# Patient Record
Sex: Male | Born: 1955 | Race: White | Hispanic: No | Marital: Married | Smoking: Former smoker
Health system: Southern US, Community
[De-identification: ages and names within clinical notes are randomized; demographics above are authoritative.]

## PROBLEM LIST (undated history)

## (undated) DIAGNOSIS — M1990 Unspecified osteoarthritis, unspecified site: Secondary | ICD-10-CM

## (undated) DIAGNOSIS — Z8709 Personal history of other diseases of the respiratory system: Secondary | ICD-10-CM

## (undated) DIAGNOSIS — G8929 Other chronic pain: Secondary | ICD-10-CM

## (undated) DIAGNOSIS — I10 Essential (primary) hypertension: Secondary | ICD-10-CM

## (undated) DIAGNOSIS — R200 Anesthesia of skin: Secondary | ICD-10-CM

## (undated) DIAGNOSIS — E785 Hyperlipidemia, unspecified: Secondary | ICD-10-CM

## (undated) DIAGNOSIS — M549 Dorsalgia, unspecified: Secondary | ICD-10-CM

## (undated) DIAGNOSIS — M2550 Pain in unspecified joint: Secondary | ICD-10-CM

## (undated) DIAGNOSIS — K219 Gastro-esophageal reflux disease without esophagitis: Secondary | ICD-10-CM

## (undated) DIAGNOSIS — M5030 Other cervical disc degeneration, unspecified cervical region: Secondary | ICD-10-CM

## (undated) DIAGNOSIS — T4145XA Adverse effect of unspecified anesthetic, initial encounter: Secondary | ICD-10-CM

## (undated) HISTORY — DX: Other cervical disc degeneration, unspecified cervical region: M50.30

## (undated) HISTORY — PX: COLONOSCOPY: SHX174

## (undated) HISTORY — PX: SHOULDER ARTHROSCOPY: SHX128

## (undated) HISTORY — PX: ANKLE SURGERY: SHX546

## (undated) HISTORY — PX: CARDIAC CATHETERIZATION: SHX172

## (undated) HISTORY — PX: CERVICAL FUSION: SHX112

## (undated) HISTORY — PX: TONSILLECTOMY: SUR1361

## (undated) HISTORY — PX: BACK SURGERY: SHX140

---

## 1999-10-23 ENCOUNTER — Ambulatory Visit (HOSPITAL_COMMUNITY): Admission: RE | Admit: 1999-10-23 | Discharge: 1999-10-23 | Payer: Self-pay | Admitting: Neurosurgery

## 1999-10-23 ENCOUNTER — Encounter: Payer: Self-pay | Admitting: Neurosurgery

## 1999-11-06 ENCOUNTER — Encounter: Payer: Self-pay | Admitting: Neurosurgery

## 1999-11-06 ENCOUNTER — Ambulatory Visit (HOSPITAL_COMMUNITY): Admission: RE | Admit: 1999-11-06 | Discharge: 1999-11-06 | Payer: Self-pay | Admitting: Neurosurgery

## 1999-11-28 ENCOUNTER — Encounter: Payer: Self-pay | Admitting: Neurosurgery

## 1999-11-28 ENCOUNTER — Ambulatory Visit (HOSPITAL_COMMUNITY): Admission: RE | Admit: 1999-11-28 | Discharge: 1999-11-28 | Payer: Self-pay | Admitting: Neurosurgery

## 2000-07-26 ENCOUNTER — Encounter: Payer: Self-pay | Admitting: Neurosurgery

## 2000-07-26 ENCOUNTER — Ambulatory Visit (HOSPITAL_COMMUNITY): Admission: RE | Admit: 2000-07-26 | Discharge: 2000-07-26 | Payer: Self-pay | Admitting: Neurosurgery

## 2000-08-09 ENCOUNTER — Encounter: Payer: Self-pay | Admitting: Neurosurgery

## 2000-08-09 ENCOUNTER — Ambulatory Visit (HOSPITAL_COMMUNITY): Admission: RE | Admit: 2000-08-09 | Discharge: 2000-08-09 | Payer: Self-pay | Admitting: Neurosurgery

## 2001-03-07 ENCOUNTER — Encounter: Payer: Self-pay | Admitting: Family Medicine

## 2001-03-07 ENCOUNTER — Ambulatory Visit (HOSPITAL_COMMUNITY): Admission: RE | Admit: 2001-03-07 | Discharge: 2001-03-07 | Payer: Self-pay | Admitting: Family Medicine

## 2001-04-05 ENCOUNTER — Encounter: Payer: Self-pay | Admitting: Neurosurgery

## 2001-04-06 ENCOUNTER — Observation Stay (HOSPITAL_COMMUNITY): Admission: RE | Admit: 2001-04-06 | Discharge: 2001-04-07 | Payer: Self-pay | Admitting: Neurosurgery

## 2001-04-06 ENCOUNTER — Encounter: Payer: Self-pay | Admitting: Neurosurgery

## 2001-11-21 ENCOUNTER — Observation Stay (HOSPITAL_COMMUNITY): Admission: EM | Admit: 2001-11-21 | Discharge: 2001-11-23 | Payer: Self-pay | Admitting: Emergency Medicine

## 2001-11-21 ENCOUNTER — Encounter: Payer: Self-pay | Admitting: Emergency Medicine

## 2001-12-12 ENCOUNTER — Ambulatory Visit (HOSPITAL_COMMUNITY): Admission: RE | Admit: 2001-12-12 | Discharge: 2001-12-12 | Payer: Self-pay | Admitting: *Deleted

## 2002-01-20 ENCOUNTER — Ambulatory Visit (HOSPITAL_COMMUNITY): Admission: RE | Admit: 2002-01-20 | Discharge: 2002-01-20 | Payer: Self-pay | Admitting: Cardiology

## 2002-01-20 ENCOUNTER — Encounter: Payer: Self-pay | Admitting: Cardiology

## 2002-02-02 ENCOUNTER — Ambulatory Visit (HOSPITAL_COMMUNITY): Admission: RE | Admit: 2002-02-02 | Discharge: 2002-02-02 | Payer: Self-pay | Admitting: Family Medicine

## 2002-02-02 ENCOUNTER — Encounter: Payer: Self-pay | Admitting: Family Medicine

## 2002-12-01 ENCOUNTER — Encounter: Payer: Self-pay | Admitting: Internal Medicine

## 2002-12-01 ENCOUNTER — Ambulatory Visit (HOSPITAL_COMMUNITY): Admission: RE | Admit: 2002-12-01 | Discharge: 2002-12-01 | Payer: Self-pay | Admitting: Internal Medicine

## 2004-08-18 ENCOUNTER — Ambulatory Visit (HOSPITAL_COMMUNITY): Admission: RE | Admit: 2004-08-18 | Discharge: 2004-08-18 | Payer: Self-pay | Admitting: General Surgery

## 2004-11-19 ENCOUNTER — Ambulatory Visit (HOSPITAL_COMMUNITY): Admission: RE | Admit: 2004-11-19 | Discharge: 2004-11-19 | Payer: Self-pay | Admitting: Family Medicine

## 2006-03-05 ENCOUNTER — Ambulatory Visit (HOSPITAL_COMMUNITY): Admission: RE | Admit: 2006-03-05 | Discharge: 2006-03-05 | Payer: Self-pay | Admitting: Family Medicine

## 2006-11-21 ENCOUNTER — Emergency Department (HOSPITAL_COMMUNITY): Admission: EM | Admit: 2006-11-21 | Discharge: 2006-11-21 | Payer: Self-pay | Admitting: Emergency Medicine

## 2006-11-24 ENCOUNTER — Ambulatory Visit (HOSPITAL_COMMUNITY): Admission: RE | Admit: 2006-11-24 | Discharge: 2006-11-24 | Payer: Self-pay | Admitting: Family Medicine

## 2007-05-24 ENCOUNTER — Ambulatory Visit (HOSPITAL_COMMUNITY): Admission: RE | Admit: 2007-05-24 | Discharge: 2007-05-24 | Payer: Self-pay | Admitting: Family Medicine

## 2007-10-22 ENCOUNTER — Emergency Department (HOSPITAL_COMMUNITY): Admission: RE | Admit: 2007-10-22 | Discharge: 2007-10-22 | Payer: Self-pay | Admitting: Internal Medicine

## 2007-10-28 ENCOUNTER — Ambulatory Visit (HOSPITAL_COMMUNITY): Admission: RE | Admit: 2007-10-28 | Discharge: 2007-10-28 | Payer: Self-pay | Admitting: Internal Medicine

## 2007-12-30 ENCOUNTER — Ambulatory Visit (HOSPITAL_COMMUNITY): Admission: RE | Admit: 2007-12-30 | Discharge: 2007-12-30 | Payer: Self-pay | Admitting: Internal Medicine

## 2009-04-29 ENCOUNTER — Ambulatory Visit (HOSPITAL_COMMUNITY): Admission: RE | Admit: 2009-04-29 | Discharge: 2009-04-29 | Payer: Self-pay | Admitting: Family Medicine

## 2009-04-30 ENCOUNTER — Ambulatory Visit (HOSPITAL_COMMUNITY): Admission: RE | Admit: 2009-04-30 | Discharge: 2009-04-30 | Payer: Self-pay | Admitting: Family Medicine

## 2009-10-07 ENCOUNTER — Ambulatory Visit (HOSPITAL_COMMUNITY): Admission: RE | Admit: 2009-10-07 | Discharge: 2009-10-07 | Payer: Self-pay | Admitting: Cardiology

## 2009-10-10 ENCOUNTER — Ambulatory Visit (HOSPITAL_COMMUNITY): Admission: RE | Admit: 2009-10-10 | Discharge: 2009-10-10 | Payer: Self-pay | Admitting: Cardiology

## 2010-03-17 ENCOUNTER — Ambulatory Visit (HOSPITAL_COMMUNITY): Admission: RE | Admit: 2010-03-17 | Discharge: 2010-03-17 | Payer: Self-pay | Admitting: Neurosurgery

## 2010-04-16 ENCOUNTER — Ambulatory Visit (HOSPITAL_COMMUNITY): Admission: RE | Admit: 2010-04-16 | Discharge: 2010-04-16 | Payer: Self-pay | Admitting: Neurosurgery

## 2010-09-03 LAB — CBC
Hemoglobin: 12.7 g/dL — ABNORMAL LOW (ref 13.0–17.0)
RBC: 4.44 MIL/uL (ref 4.22–5.81)
WBC: 7.7 10*3/uL (ref 4.0–10.5)

## 2010-09-03 LAB — SURGICAL PCR SCREEN
MRSA, PCR: NEGATIVE
Staphylococcus aureus: POSITIVE — AB

## 2010-09-03 LAB — BASIC METABOLIC PANEL
Calcium: 9.7 mg/dL (ref 8.4–10.5)
GFR calc Af Amer: >60 mL/min (ref >60)
GFR calc non Af Amer: >60 mL/min (ref >60)
Sodium: 140 mEq/L (ref 135–145)

## 2010-11-07 NOTE — H&P (Signed)
[facility]  Patient:    William Gill, William Gill Visit Number: [id number] MRN: [id number]          Service Type: OBV Location: 2A A227 01 Attending Physician:  Patrica Duel Dictated by:   Patrica Duel, M.D. Admit Date:  11/21/2001 Discharge Date: 11/23/2001                           History and Physical  CHIEF COMPLAINT:  Palpitations.  HISTORY OF PRESENT ILLNESS:  This is a 55 year old male with an essentially benign past history.  He works at [employer].  He has been treated in the past with atenolol 50 mg for "fast heart beat and high blood pressure" for approximately three years.  The patient has been under a great deal of stress in the past several weeks due to his wifes recent diagnosis of pulmonary hypertension.  At work, the patient experienced palpitations and went to the nurse.  She reported his blood pressure at 180/120 with a heart rate of 140.  This gradually resolved. During this episode, he had no chest pain, shortness of breath, diaphoresis or syncope.  He was brought to the emergency department where his initial evaluation was essentially benign.  Occasional PVCs were reported by the EMS, though no strips were obtained.  He is admitted for further evaluation and therapy of hypertension and arrhythmia of questionable morphology.  There is no history of headache or neurologic deficits, abdominal pain, nausea, vomiting, diarrhea or genitourinary symptoms.  CURRENT MEDICATIONS:  Atenolol 50 mg q.d.  ALLERGIES:  None known.  PAST MEDICAL HISTORY:  As noted above.  FAMILY HISTORY:  Mother died at age 63 of lung carcinoma.  Father is healthy at age 37, status post left carotid endarterectomy.  No reported coronary disease.  REVIEW OF SYSTEMS:  Negative except as mentioned.  SOCIAL HISTORY:  He quit smoking cigarettes approximately one year ago.  He has occasional beer only.    PHYSICAL EXAMINATION:  GENERAL:  A very pleasant  male in no acute distress.  VITAL SIGNS AT PRESENTATION:  Pulse 98, respirations 20, blood pressure 167/93, O2 saturation 98%.  HEENT:  Normocephalic, atraumatic.  The pupils are equal.  Ears, nose and throat benign.  NECK:  Supple.  No bruits, thyromegaly or lymphadenopathy.  LUNGS:  Clear.  HEART:  Heart sounds are normal without murmurs, rubs, or gallops.  Heart rate is approximately 67 and no ectopy noted on monitor.  ABDOMEN:  Nontender and nondistended.  No bruits, masses or organomegaly.  EXTREMITIES:  No clubbing, cyanosis, or edema.  NEUROLOGIC:  No focal deficits.  LABORATORY AND ACCESSORY DATA:  EKG:  Normal sinus rhythm.  Poor R wave progression.  Nonspecific ST-T changes.  Cardiac enzymes negative.  MET-7 normal.  CBC normal.  ASSESSMENT: 1. Palpitations and arrhythmia which is yet to be defined.  Occasional    premature ventricular contractions documented. 2. History of tachyarrhythmias and hypertension, currently on beta blocker.  PLAN:  Rule out MI, cardiology consult, increase beta blocker, routine lab review including thyroid functions and add anxiolytics p.r.n. and will follow and treat expectantly. Dictated by:   Patrica Duel, M.D. Attending Physician:  Patrica Duel DD:  11/22/01 TD:  11/23/01 Job: [id number]

## 2010-11-07 NOTE — Cardiovascular Report (Signed)
NAME:  William Gill, William Gill NO.:  [id number]   MEDICAL RECORD NO.:  [id number]                   PATIENT TYPE:  OIB   LOCATION:  2899                                 FACILITY:  [facility]   PHYSICIAN:  Cristy Hilts. Jacinto Halim, M.D.                  DATE OF BIRTH:  1955-07-28   DATE OF PROCEDURE:  01/20/2002  DATE OF DISCHARGE:  01/20/2002                              CARDIAC CATHETERIZATION   PROCEDURE PERFORMED:  1. Left ventriculography.  2. Selective right and left arteriographies.  3. Right femoral angiography and closure of right femoral artery access with     Perclose.   INDICATION:  The patient is a 55 year old gentleman with history of  hypertension, hypercholesterolemia, family history of premature coronary  artery disease, who presents with palpitations and who had undergone a  Cardiolite stress test that revealed anterior wall ischemia.  He was brought  to the cardiac catheterization lab to evaluate for coronary anatomy.   HEMODYNAMIC DATA:  The left ventricular pressures were 131 with an end-  diastolic pressure of 19 mmHg.  The aortic pressure was 132/75 with a mean  of 96 mmHg.  There was no pressure gradient across the aortic valve.   ANGIOGRAPHIC DATA:  Left ventricle:  Left ventricular systolic function was  normal at 65%.  There is no wall motion abnormality.  There is no  significant mitral regurgitation.   Right coronary artery:  Right coronary artery is a nondominant artery.  It  is normal.   Left main coronary artery:  Left main coronary artery is a large-caliber  vessel.  It is normal.   Circumflex coronary artery:  Circumflex coronary artery is a large-caliber  vessel.  It gives origin to a large obtuse marginal 1 and also gives origin  to several small PDA branches.  The circumflex coronary artery is a dominant  artery.   Ramus intermedius:  The ramus branch is normal and a moderate-sized vessel.   Left anterior descending artery:  The  left anterior descending artery is a  large-caliber vessel.  It gives origin to  a small diagonal 1 and diagonal  2.  It wraps around the apex.  It is __________ .   Right femoral artery angiography revealed good arterial access.   IMPRESSION:  1. Normal left ventricular systolic function with ejection fraction 65%.     Normal coronary arteries.  2. Mild elevated left ventricular end-diastolic pressure secondary to     hypertension and hypertensive heart disease.   RECOMMENDATION:  Continued medical therapy is advised.   TECHNIQUE OF PROCEDURE:  Under the usual sterile precautions, using a 6-  French right femoral artery __________ catheter was advised to the ascending  aorta over a 0.035-mm __________ .  The catheter was gently advanced to the  left ventricle and left ventricular pressure was monitored.  Hand contrast  injection of the left ventricle  was performed, both in the LAO and RAO  positions, and the catheter was, on first attempt, pulled back into the  ascending aorta and pressure gradient of the aortic valve was monitored.  Right coronary artery was selectively engaged and angiography was performed.  Because of significant spasm noted initially before the angiogram, 100 mcg  of intracoronary nitroglycerin were administered.  Then the catheter was  manipulated and engaged the left main coronary artery and angiography was  performed.  Then the catheter was pulled out of the body.  Right femoral  angiography was performed through the arterial access and the access was  closed with Perclose and excellent hemostasis obtained.  Patient was  transferred to the recovery in a stable condition.  Patient tolerated  procedure well.                                               Cristy Hilts. Jacinto Halim, M.D.    Pilar Plate  D:  01/20/2002  T:  01/26/2002  Job:  [id number]   cc:   Kirk Ruths, M.D.   Sherral Hammers, M.D.

## 2010-11-07 NOTE — Op Note (Signed)
[place]  Patient:    William Gill, William Gill Visit Number: [id number] MRN: [id number]          Service Type: SUR Location: 3000 3004 01 Attending Physician:  Coletta Memos Dictated by:   Mena Goes. Franky Macho, M.D. Proc. Date: 04/06/01 Admit Date:  04/06/2001 Discharge Date: 04/07/2001                             Operative Report  PREOPERATIVE DIAGNOSIS:  Left L5 radiculopathy.  POSTOPERATIVE DIAGNOSIS:  Left L5 radiculopathy.  OPERATION PERFORMED:  Far lateral diskectomy L5-S1, L5 foraminotomy with microdissection.  SURGEON:  Kyle L. Franky Macho, M.D.  ASSISTANT:  Hewitt Shorts, M.D.  COMPLICATIONS:  None.  ANESTHESIA:  General endotracheal.  INDICATIONS FOR PROCEDURE:  The patient is a gentleman who presented with pain in the left lower extremity which he has had now for the last two years. Conservative treatments have been attempted in the past and he had improved but he came back with worsening pain.  Conservative measures at this time did not help.  I recommended and he agreed to undergo an L5 decompression.  DESCRIPTION OF PROCEDURE:  The patient was taken to the operating room and intubated and placed under general anesthesia without difficulty.  He was placed on a Wilson frame and all pressure points were properly padded.  The skin was prepped and he was draped in a sterile fashion.  I placed a spinal needle and preoperative x-ray showed the needle to be pointing to the spinous process of L5.  Using that as a guide, I then opened the skin, took this down to the L5 spinous process and exposed the L5 lamina along with S1.  I took another x-ray and it showed I was working in the correct level.  I then identified a facet at L5-S1 and the facet at L4-5.  Both were quite hypertrophic.  I then exposed the pars interarticularis and drilled that out medially.  After removing a significant amount of tissue, I was then able to identify the path  of the nerve root.  I brought the microscope into the operative field and with Dr. Gae Dry assistance, we were able to then remove enough soft tissue to expose the L5 nerve root and the neural foramen. I did generous foraminotomies going both medially and then laterally.  I did a superior facetectomy of S1 on the left side.  I also performed a semihemilaminectomy of L5 to expose the disk space and the thecal sac.  The compression, however, was clearly proximal to that level and was at the L5 root.  After retracting the L5 root rostrally, I was able to appreciate what were spurs and a small disk bulge at L5-S1.  I opened that with a #11 blade and with microscopic dissection, I was able to remove enough disk that I felt there was good decompression of the nerve root.  I then inspected the nerve root going out its pathway and felt that there was good decompression both medially and laterally.  I then irrigated the wound.  I then placed epidural steroids.  I then closed the wound in layered using Vicryl sutures.  Sterile dressing applied with Dermabond.  The patient tolerated the procedure well and was extubated moving all extremities. Dictated by:   Mena Goes. Franky Macho, M.D. Attending Physician:  Coletta Memos DD:  04/06/01 TD:  04/07/01 Job: [id number]

## 2010-11-07 NOTE — H&P (Signed)
NAME:  William Gill, William Gill NO.:  [id number]   MEDICAL RECORD NO.:  [id number]           PATIENT TYPE:   LOCATION:                                 FACILITY:   PHYSICIAN:  Dalia Heading, M.D.  DATE OF BIRTH:  December 27, 1955   DATE OF ADMISSION:  DATE OF DISCHARGE:  LH                                HISTORY & PHYSICAL   CHIEF COMPLAINT:  Hematochezia.   HISTORY OF PRESENT ILLNESS:  The patient is 55 year old white male who is  referred for endoscopic evaluation.  He needs colonoscopy for hematochezia.  He had episodes of blood per rectum several weeks ago.  No recent abdominal  pain, weight loss, nausea, vomiting, diarrhea, constipation or melena have  been noted.  He has never had a colonoscopy.  There is no family history of  colon carcinoma.   PAST MEDICAL HISTORY:  Includes hypertension and back difficulties.   PAST SURGICAL HISTORY:  Back surgery.   CURRENT MEDICATIONS:  Atenolol, Norvasc, Vytorin, Tricor, Naprosyn, Lorcet,  Prilosec.   ALLERGIES:  No known drug allergies.   REVIEW OF SYSTEMS:  Patient denies smoking, drinks only occasionally.  No  other cardiopulmonary difficulties are noted.   PHYSICAL EXAMINATION:  GENERAL:  Patient is a well-developed, well-nourished  white male in no acute distress.  VITAL SIGNS:  He is afebrile; vital signs are stable.  LUNGS:  Clear to auscultation with equal breath sounds bilaterally.  HEART:  Reveals a regular rate and rhythm without S3, S4 or murmurs.  ABDOMEN:  Soft, nontender, nondistended.  No hepatosplenomegaly or masses  are noted.  RECTAL:  Examination is deferred to the procedure.   IMPRESSION:  Hematochezia.   PLAN:  The patient was scheduled for a colonoscopy on August 18, 2004.  The risks and benefits of the procedure including bleeding and perforation  were fully explained to the patient, who gave informed consent.      MAJ/MEDQ  D:  08/05/2004  T:  08/05/2004  Job:  [id number]   cc:   Dalia Heading, M.D.  [address] [place]  [place]  [place] [address]  Fax: [phone]   Kirk Ruths, M.D.  [address]  [place]  [place] [address]  Fax: [phone]

## 2010-11-07 NOTE — H&P (Signed)
[facility]  Patient:    LYNDON, CHAPEL Visit Number: [id number] MRN: [id number]          Service Type: SUR Location: 3000 3004 01 Attending Physician:  Coletta Memos Dictated by:   Mena Goes. Franky Macho, M.D. Admit Date:  04/06/2001                           History and Physical  HISTORY OF PRESENT ILLNESS:  Fountain Derusha initially presented to me in December 2000.  At that time he had had pain in the left hip and thigh for approximately two to three months.  The pain was not preceded by trauma.  It did not go below the knee.  It radiated around the lateral thigh, sometimes into the medial region of the thigh.  There was no real back pain.  Soreness around the hip.  If he stood for too long, the pain became quite severe.  He works as a Location manager.  He did not and still does not have any bowel or bladder dysfunction.  Mr. Dorko underwent epidural steroids and has also taken Vicodin.  He continued to do well until March 31, 2001.  At that time he again was having pain in the left lower extremity from the hip to the level of the knee.  No problems on the right side.  The pain, he felt, was much more severe.  Recent MR scan showed a far lateral disk at L5-S1.  Again, radiologist seemed to think that 4-5 was more impressive.  I thought that that was absolutely wrong.  There is a fat signal surrounding the nerve root at L4-5 through its entire course in the neural foramen, and this was not the case at L5-S1, where lateral disk was certainly present and foraminal compression was obvious.  Mr. Going had 5/5 strength in the lower extremities.  Muscle tone, bulk, coordination are normal.  He could toe-walk and heel-walk without difficulty.  There was no clonus.  Toes are downgoing to plantar stimulation.  He had no Hoffmann sign.  Deep tendon reflexes 2+ at the knees and ankles bilaterally.  There were no cervical masses or bruits.  Lung fields clear.  Heart  regular rhythm and rate.  No murmurs or rubs.  Pulse was 68.  PAST MEDICAL HISTORY: 1. Hypertension. 2. Gastroesophageal reflux.  ALLERGIES:  No known drug allergies.  PAST SURGICAL HISTORY:  Left ankle operation 24 years ago.  SOCIAL HISTORY:  He smokes 1-1/2 packs of cigarettes since age 18.  Does not use illicit drugs. Does drink alcohol daily.  Height 6 feet 2 inches, weight approximately 188 pounds.  MEDICATIONS:  Atenolol, Aciphex, and occasional Tylox for pain.  IMPRESSION AND PLAN:  Mr. Osorto and I discussed his available options.  I believe at this point that I only have surgery to offer.  He had undergone epidural injections, which provided brief, temporary relief.  All steroids which were tried, again, by his family physician did not offer relief.  He has a far lateral disk at 5-1 and a tight neural foramen.  He has no real compression of the fourth nerve root in the neural foramen and certainly not in the disk space.  If there was any problem it would be at L5.  The procedure, including bleeding, infection, pain relief, need for further surgery, recurrent disks were discussed.  He understands and wishes to proceed. Dictated by:   Mena Goes. Franky Macho, M.D.  Attending Physician:  Coletta Memos DD:  04/06/01 TD:  04/07/01 Job: [id number]

## 2011-01-13 ENCOUNTER — Ambulatory Visit (HOSPITAL_COMMUNITY)
Admission: RE | Admit: 2011-01-13 | Discharge: 2011-01-13 | Disposition: A | Discharge status: Home / Self Care | Payer: BC Managed Care – PPO | Source: Ambulatory Visit | Attending: Family Medicine | Admitting: Family Medicine

## 2011-01-13 ENCOUNTER — Other Ambulatory Visit (HOSPITAL_COMMUNITY): Payer: Self-pay | Admitting: Family Medicine

## 2011-01-13 DIAGNOSIS — IMO0002 Reserved for concepts with insufficient information to code with codable children: Secondary | ICD-10-CM

## 2011-01-13 DIAGNOSIS — M542 Cervicalgia: Secondary | ICD-10-CM | POA: Insufficient documentation

## 2011-02-25 ENCOUNTER — Ambulatory Visit (HOSPITAL_COMMUNITY)
Admission: RE | Admit: 2011-02-25 | Discharge: 2011-02-25 | Disposition: A | Discharge status: Home / Self Care | Payer: BC Managed Care – PPO | Source: Ambulatory Visit | Attending: Neurosurgery | Admitting: Neurosurgery

## 2011-02-25 DIAGNOSIS — M542 Cervicalgia: Secondary | ICD-10-CM | POA: Insufficient documentation

## 2011-02-25 DIAGNOSIS — M539 Dorsopathy, unspecified: Secondary | ICD-10-CM | POA: Insufficient documentation

## 2011-02-25 DIAGNOSIS — IMO0001 Reserved for inherently not codable concepts without codable children: Secondary | ICD-10-CM | POA: Insufficient documentation

## 2011-02-25 DIAGNOSIS — M2560 Stiffness of unspecified joint, not elsewhere classified: Secondary | ICD-10-CM | POA: Insufficient documentation

## 2011-02-25 DIAGNOSIS — M6281 Muscle weakness (generalized): Secondary | ICD-10-CM | POA: Insufficient documentation

## 2011-02-25 NOTE — Patient Instructions (Addendum)
HEP

## 2011-02-25 NOTE — Progress Notes (Signed)
Physical Therapy Evaluation  Patient Details  Name: JERRID FORGETTE MRN: [id number] Date of Birth: [birth year]  Today's Date: 02/25/2011 Time: 1620-1710 Time Calculation (min): 50 min Visit#: 1 of 8 Re-eval: 03/27/11  Past Medical History: No past medical history on file. Past Surgical History: No past surgical history on file.  Subjective Symptoms/Limitations Symptoms: Pt states that he woke up June 18th  with significant neck pain on the left side.  His PMH is significant for cervical fusion Oct. of 2011due having pain on his right side which radiated to the shoulder.    He went to the MD after his left neck started to bother him.  He was given anti-inflammatory medication wheich did not give him any relief.  The paiient has been referred to Pt to try and decreased his pain. How long can you sit comfortably?: does not increase pain How long can you stand comfortably?: liimited secondary to back pain How long can you walk comfortably?: limited secondary to back pain Special Tests: Pt is unable to sleep waking up at least three times a night.  When the pt takes oxycodene he can get four hours of sleep. Pain Assessment Currently in Pain?: Yes Pain Score:   2 (highest is a 9 or 10 in am) Pain Location: Neck Pain Orientation: Left Pain Type: Chronic pain Pain Onset: More than a month ago Pain Frequency: Constant Pain Relieving Factors: pain meds. Effect of Pain on Daily Activities: work duties increases pain due to having to turn is head multiple times a day.  Precautions/Restrictions     Prior Functioning  Prior Function Vocation: Full time employment Leisure: Hobbies-no  Cognition Cognition Overall Cognitive Status: Appears within functional limits for tasks assessed Arousal/Alertness: Awake/alert  Sensation/Coordination/Flexibility    Assessment Cervical AROM Cervical Flexion:  (decreased 30%) Cervical Extension:  (wnl) Cervical - Right Side Bend:  (decreased  50%) Cervical - Left Side Bend:  (decreased 70%) Cervical - Right Rotation:  (decreased 10%) Cervical - Left Rotation:  (decreased 50%) Cervical Strength Cervical Flexion: 3/5 Cervical Extension: 3/5 Cervical - Right Side Bend: 3/5 Cervical - Left Side Bend: 2+/5  Mobility (including Balance)       Exercise/Treatments Stretches   Neck Exercises Neck Flexion:  (cervical isometric 5x for B SB and extension) Neck Extension: AROM;10 reps Neck Lateral Flexion - Right: AROM;10 reps Neck Lateral Flexion - Left: AROM;10 reps Additional Neck Exercises    Modalities Modalities: Cryotherapy Manual Therapy Manual Therapy: Other (comment) Other Manual Therapy: massage to cervical area concentrating on the L;  attempted suboccipital release with increased pain Cryotherapy Number Minutes Cryotherapy: 10 Minutes Cryotherapy Location: Neck Type of Cryotherapy: Ice pack  Physical Therapy Assessment and Plan PT Assessment and Plan Clinical Impression Statement: Pt with increased pain decreased ROM and weakness who will benefit from PT to improve function and quality of life. Rehab Potential: Good PT Frequency: Min 2X/week PT Duration: 4 weeks PT Treatment/Interventions: Therapeutic exercise PT Plan: see Pt 2x/wk for 4 weeks for strengthening and flexibillity.  Begin c-retraction, w-back, and T-Band exercises next visit.    Goals PT Short Term Goals Time to Complete Goals: 2 weeks PT Short Term Goal 1: I HEP PT Short Term Goal 2: Pain no greater than a 5 to allow pt to only be waking 1-2 times a night. PT Long Term Goals PT Long Term Goal 1: 4 wk I in advance HEP PT Long Term Goal 2: strength increased one grade to decrease pain to no greater than a  3 Long Term Goal 3: Pt to be sleeping 6 hours a night-4 wk  Long Term Goal 4: ROM wnl to allow safe driving.-4wk  Problem List Patient Active Problem List  Diagnoses  . Stiffness of joints, not elsewhere classified, multiple sites   . Muscle weakness (generalized)    PT - End of Session Activity Tolerance: Patient tolerated treatment well General Behavior During Session: [facility] for tasks performed Cognition: [facility] for tasks performed   RUSSELL,CINDY 02/25/2011, 5:15 PM  Physician Documentation Your signature is required to indicate approval of the treatment plan as stated above.  Please sign and either send electronically or make a copy of this report for your files and return this physician signed original.   Please mark one 1.__approve of plan  2. ___approve of plan with the following conditions.   ______________________________                                                          _____________________ Physician Signature                                                                                                             Date

## 2011-03-03 ENCOUNTER — Ambulatory Visit (HOSPITAL_COMMUNITY)
Admission: RE | Admit: 2011-03-03 | Discharge: 2011-03-03 | Disposition: A | Discharge status: Home / Self Care | Payer: BC Managed Care – PPO | Source: Ambulatory Visit | Attending: Family Medicine | Admitting: Family Medicine

## 2011-03-03 NOTE — Progress Notes (Signed)
Physical Therapy Treatment Patient Details  Name: TEODOR PRATER MRN: [id number] Date of Birth: 09-12-1955  Today's Date: 03/03/2011 Time: 7829-5621 Time Calculation (min): 42 min Visit#: 2 of 8 Re-eval: 03/27/11 Charges:  therex  20', massage 10', ice 10'  Subjective: Symptoms/Limitations Symptoms: Pt. reports the pain is about the same, 3/10 today. Pain Assessment Currently in Pain?: Yes Pain Score:   3 Pain Location: Neck Pain Orientation: Left   Exercise/Treatments Warm-up: UBE 4' backwards Standing: Tband postural three: Retraction  10X green Rows  10X green Extension  10X green Seated:  cervical isometric 5x for B SB and extension (HEP)  Neck Extension: AROM;10 reps  Sidebends bilaterally 10 reps each  W-Back 10X Cervical Retraction 10X  Modalities Modalities: Cryotherapy Manual Therapy Other Manual Therapy: massage to cervical area L upper trap Cryotherapy Number Minutes Cryotherapy: 10 Minutes Cryotherapy Location: Neck Type of Cryotherapy: Ice pack  Physical Therapy Assessment and Plan PT Assessment and Plan Clinical Impression Statement: Added tband postural therex, cervical retraction, and w-back with good form and no pain.  One small spasm palpated in L rhomboid region, none found in L upper trap region.  Pt. reported no decrease in pain following session today. PT Treatment/Interventions: Therapeutic exercise (Massage, icepack) PT Plan: Progress cervical strength and ROM; trial of different modality next visit if no pain reduction.    Problem List Patient Active Problem List  Diagnoses  . Stiffness of joints, not elsewhere classified, multiple sites  . Muscle weakness (generalized)    PT - End of Session Activity Tolerance: Patient tolerated treatment well General Behavior During Session: [facility] for tasks performed Cognition: [facility] for tasks performed  Lurena Nida 03/03/2011, 4:49 PM

## 2011-03-05 ENCOUNTER — Ambulatory Visit (HOSPITAL_COMMUNITY)
Admission: RE | Admit: 2011-03-05 | Discharge: 2011-03-05 | Disposition: A | Discharge status: Home / Self Care | Payer: BC Managed Care – PPO | Source: Ambulatory Visit | Attending: Physical Therapy | Admitting: Physical Therapy

## 2011-03-05 DIAGNOSIS — M6281 Muscle weakness (generalized): Secondary | ICD-10-CM

## 2011-03-05 DIAGNOSIS — M2560 Stiffness of unspecified joint, not elsewhere classified: Secondary | ICD-10-CM

## 2011-03-05 NOTE — Progress Notes (Signed)
Physical Therapy Treatment Patient Details  Name: William Gill MRN: [id number] Date of Birth: December 05, 1955  Today's Date: 03/05/2011 Time: 7829-5621 Time Calculation (min): 35 min Visit#: 3 of 8 Re-eval: 03/27/11   Charge:  Ultrasound 15; There ex 15 Subjective: Symptoms/Limitations Symptoms: My pain is about the same a 3/10. Pt comes to department holding neck very stiff. Pain Assessment Pain Score:   3 Pain Location: Neck Pain Orientation: Left Pain Type: Chronic pain  Precautions/Restrictions     Mobility (including Balance)       Exercise/Treatments Stretches   Neck Exercises Neck Retraction: 10 reps Neck Extension with Towel Roll: 10 reps Shoulder Extension: Strengthening;Both;15 reps;Standing;Theraband Theraband Level (Shoulder Extension): Level 3 (Green) Row: Strengthening;Both;15 reps;Standing;Theraband Theraband Level (Row): Level 3 (Green) Scapular Retraction: Strengthening;Both;15 reps;Standing;Theraband Theraband Level (Scapular Retraction): Level 3 (Green) W Back: 15 reps (2#) Additional Neck Exercises    Modalities Modalities: Ultrasound Manual Therapy Other Manual Therapy: massage Ultrasound Ultrasound Location: C2-7 Ultrasound Parameters: 1.4w/cm2 @ 3 mg-Hz Ultrasound Goals: Pain  Physical Therapy Assessment and Plan PT Assessment and Plan Clinical Impression Statement: Pt states US decreased pain improved ROM;.  mm spasm along L rhomboid;  Rehab Potential: Good PT Plan: D/C T-band exercises as pt had an impingement and does the T-band exercises at home.  Begin prone chin tuck head lift.    Goals    Problem List Patient Active Problem List  Diagnoses  . Stiffness of joints, not elsewhere classified, multiple sites  . Muscle weakness (generalized)    PT - End of Session Activity Tolerance: Patient tolerated treatment well General Behavior During Session: [facility] for tasks performed Cognition: [facility] for tasks  performed  RUSSELL,CINDY 03/05/2011, 4:38 PM

## 2011-03-10 ENCOUNTER — Ambulatory Visit (HOSPITAL_COMMUNITY)
Admission: RE | Admit: 2011-03-10 | Discharge: 2011-03-10 | Disposition: A | Discharge status: Home / Self Care | Payer: BC Managed Care – PPO | Source: Ambulatory Visit | Attending: *Deleted | Admitting: *Deleted

## 2011-03-10 NOTE — Progress Notes (Signed)
Physical Therapy Treatment Patient Details  Name: William Gill MRN: [id number] Date of Birth: 10/23/1955  Today's Date: 03/10/2011 Time: 4782-9562 Time Calculation (min): 44 min Visit#: 4  of 8   Re-eval: 03/27/11 Charges: Therex x 18' Manual x 12' [place] x 8'  Subjective: Symptoms/Limitations Symptoms: My pain is the same as usual. I really think the ultrasound helped. Pt reports HEP compliance. Pain Assessment Currently in Pain?: Yes Pain Score:   3 Pain Location: Neck Pain Orientation: Left Pain Type: Chronic pain  Exercise/Treatments Neck Exercises Neck Retraction: 10 reps;Strengthening (10x seated and in prone) Neck Extension with Towel Roll: 10 reps Shoulder Extension: Strengthening;Both;10 reps;Prone W Back: 15 reps;Weight W Back Weights (lbs): 2 X to V: Seated;10 reps Additional Neck Exercises UBE (Upper Arm Bike): 4'@2 .0 backward  Modalities Modalities: Ultrasound Manual Therapy Manual Therapy: Other (comment) Other Manual Therapy: STM to L upper trap and rhomboid Ultrasound Ultrasound Location: L upper trap/ cervical Ultrasound Parameters: 1.4w/cm2 @ 3 mHz  Ultrasound Goals: Pain  Physical Therapy Assessment and Plan PT Assessment and Plan Clinical Impression Statement: Pt with mm spasm/tightness in L upper trap and rhomboid. Began prone cervical retraction and prone shoulder ext without difficulty. Pt with pain decrease to 1/10 at end of session. PT Treatment/Interventions: Therapeutic exercise;Other (comment) (Manual therapy; [place]) PT Plan: Continue to progress per PT POC. Assess pain next tx.     Problem List Patient Active Problem List  Diagnoses  . Stiffness of joints, not elsewhere classified, multiple sites  . Muscle weakness (generalized)    PT - End of Session Activity Tolerance: Patient tolerated treatment well General Behavior During Session: [facility] for tasks performed Cognition: [facility] for tasks performed  Antonieta Iba 03/10/2011,  5:38 PM

## 2011-03-12 ENCOUNTER — Ambulatory Visit (HOSPITAL_COMMUNITY)
Admission: RE | Admit: 2011-03-12 | Discharge: 2011-03-12 | Disposition: A | Discharge status: Home / Self Care | Payer: BC Managed Care – PPO | Source: Ambulatory Visit | Attending: Neurosurgery | Admitting: Neurosurgery

## 2011-03-12 NOTE — Progress Notes (Signed)
Physical Therapy Treatment Patient Details  Name: William Gill MRN: [id number] Date of Birth: 1956-04-30  Today's Date: 03/12/2011 Time: 7829-5621 Time Calculation (min): 43 min Visit#: 5  of 8   Re-eval: 03/27/11 Charges:  Therex 16, STM 10', Ultrasound 8'    Subjective: Symptoms/Limitations Symptoms: Pt reports he had some throbbing in his neck after last tx. Pain Assessment Currently in Pain?: Yes Pain Score:   3 Pain Location: Neck Pain Orientation: Left   Exercise/Treatments Neck Exercises Neck Retraction: 15 reps W Back: 15 reps W Back Weights (lbs): 2 X to V: 10 reps;Weight X to V Weights (lbs): 2 Additional Neck Exercises UBE (Upper Arm Bike): 4'@2 .0 backward  Modalities Modalities: Ultrasound Manual Therapy Other Manual Therapy: STM to L upper trap and rhomboid Ultrasound Ultrasound Location: L upper trap/cervical Ultrasound Parameters: 1.4w/cm2 with 3 MHz for 8 minutes Ultrasound Goals: Pain  Physical Therapy Assessment and Plan PT Assessment and Plan Clinical Impression Statement: Multiple spasms in L rhomboid and trap region resolved with STM.  Pt. given tband and written instructions for HEP.  Pain decreased overall to a 1/10 at end of session.  Prone therex held today due to throbbing reported after last session. PT Treatment/Interventions: Therapeutic exercise (Manual therapy, ultrasound) PT Plan: Continue to progress per POC.  Resume prone therex next visit if pain improved.    Goals PT Short Term Goals Time to Complete Short Term Goals: 2 weeks PT Short Term Goal 1: I HEP PT Short Term Goal 1 - Progress: Progressing toward goal PT Short Term Goal 2: Pain no greater than a 5 to allow pt to only be waking 1-2 times a night. PT Short Term Goal 2 - Progress: Progressing toward goal (pain decreased but still waking) PT Long Term Goals PT Long Term Goal 1: 4 wk I in advance HEP PT Long Term Goal 1 - Progress: Not met PT Long Term Goal 2: strength  increased one grade to decrease pain to no greater than a 3 PT Long Term Goal 2 - Progress: Progressing toward goal Long Term Goal 3: Pt to be sleeping 6 hours a night-4 wk  Long Term Goal 3 Progress: Not met Long Term Goal 4: ROM wnl to allow safe driving.-4wk Long Term Goal 4 Progress: Progressing toward goal  Problem List Patient Active Problem List  Diagnoses  . Stiffness of joints, not elsewhere classified, multiple sites  . Muscle weakness (generalized)    PT - End of Session Activity Tolerance: Patient tolerated treatment well General Behavior During Session: [facility] for tasks performed Cognition: [facility] for tasks performed  Lurena Nida 03/12/2011, 4:49 PM

## 2011-03-17 ENCOUNTER — Ambulatory Visit (HOSPITAL_COMMUNITY)
Admission: RE | Admit: 2011-03-17 | Discharge: 2011-03-17 | Disposition: A | Discharge status: Home / Self Care | Payer: BC Managed Care – PPO | Source: Ambulatory Visit | Attending: Neurosurgery | Admitting: Neurosurgery

## 2011-03-17 NOTE — Progress Notes (Signed)
Physical Therapy Treatment Patient Details  Name: JALIN ALICEA MRN: [id number] Date of Birth: 1956-05-05  Today's Date: 03/17/2011 Time: 0102-7253 Time Calculation (min): 36 min Visit#: 6  of 8   Re-eval: 03/27/11 Charges:  therex 10',  IFES with MHP 15' Seth Bake, PTA started pt. on UBE today.    Subjective: Symptoms/Limitations Symptoms: Pt. reports his pain increased on Sunday after doing cervical retraction exercises.  States his pain is 4/10 today. Pain Assessment Currently in Pain?: Yes Pain Score:   4 Pain Location: Neck Pain Orientation: Left   Exercise/Treatments Neck Exercises Neck Retraction: 15 reps W Back: 15 reps W Back Weights (lbs): 2 X to V: 10 reps;Weight X to V Weights (lbs): 2 Additional Neck Exercises UBE (Upper Arm Bike): 4'@2 .0 backward  Modalities Modalities: Electrical Stimulation;Moist Heat Moist Heat Therapy Number Minutes Moist Heat: 15 Minutes Moist Heat Location:  (Neck/upper back with E-stim) Museum/gallery exhibitions officer Stimulation Location: L cervical/upper trap Electrical Stimulation Action: to decrease pain Electrical Stimulation Parameters: IFES hi/lo sweep at 12 milliamperes for 15 minutes Electrical Stimulation Goals: Pain  Physical Therapy Assessment and Plan PT Assessment and Plan Clinical Impression Statement: Pt with limited pain relief; reports pain "inside" neck.  Changed to IFES to see if decreases deeper pain.  No pain relief immediately following treatment today.  No pain today with cervical retraction exercise. PT Treatment/Interventions: Therapeutic exercise (IFES with MHP) PT Plan: Assess pain next visit.  Continue X 2 more visits.    Goals Home Exercise Program PT Goal: Perform Home Exercise Program - Progress: Met PT Short Term Goals Time to Complete Short Term Goals: 2 weeks PT Short Term Goal 1: I HEP PT Short Term Goal 1 - Progress: Met PT Short Term Goal 2: Pain no greater than a 5 to allow  pt to only be waking 1-2 times a night. PT Short Term Goal 2 - Progress: Progressing toward goal PT Long Term Goals PT Long Term Goal 1: 4 wk I in advance HEP PT Long Term Goal 1 - Progress: Progressing toward goal PT Long Term Goal 2: strength increased one grade to decrease pain to no greater than a 3 PT Long Term Goal 2 - Progress: Progressing toward goal Long Term Goal 3: Pt to be sleeping 6 hours a night-4 wk  Long Term Goal 3 Progress: Not met Long Term Goal 4: ROM wnl to allow safe driving.-4wk Long Term Goal 4 Progress: Progressing toward goal  Problem List Patient Active Problem List  Diagnoses  . Stiffness of joints, not elsewhere classified, multiple sites  . Muscle weakness (generalized)    PT - End of Session Activity Tolerance: Patient tolerated treatment well General Behavior During Session: [facility] for tasks performed Cognition: [facility] for tasks performed  Emeline Gins B 03/17/2011, 4:45 PM

## 2011-03-18 LAB — URINALYSIS, ROUTINE W REFLEX MICROSCOPIC
Bilirubin Urine: NEGATIVE
Glucose, UA: NEGATIVE
Ketones, ur: NEGATIVE
Nitrite: NEGATIVE
pH: 7

## 2011-03-18 LAB — DIFFERENTIAL
Eosinophils Absolute: 0.3
Eosinophils Relative: 3
Lymphs Abs: 2.4

## 2011-03-18 LAB — AMYLASE: Amylase: 27

## 2011-03-18 LAB — COMPREHENSIVE METABOLIC PANEL
ALT: 46
AST: 33
CO2: 26
Calcium: 9.6
Chloride: 105
GFR calc Af Amer: >60
GFR calc non Af Amer: >60
Sodium: 138

## 2011-03-18 LAB — CBC
MCHC: 35.5
RBC: 4.59
WBC: 9.1

## 2011-03-18 LAB — LIPASE, BLOOD: Lipase: 27

## 2011-03-19 ENCOUNTER — Ambulatory Visit (HOSPITAL_COMMUNITY): Payer: BC Managed Care – PPO | Admitting: *Deleted

## 2011-04-01 ENCOUNTER — Ambulatory Visit (HOSPITAL_COMMUNITY)
Admission: RE | Admit: 2011-04-01 | Discharge: 2011-04-01 | Disposition: A | Discharge status: Home / Self Care | Payer: BC Managed Care – PPO | Source: Ambulatory Visit | Attending: Family Medicine | Admitting: Family Medicine

## 2011-04-01 DIAGNOSIS — IMO0001 Reserved for inherently not codable concepts without codable children: Secondary | ICD-10-CM | POA: Insufficient documentation

## 2011-04-01 DIAGNOSIS — M6281 Muscle weakness (generalized): Secondary | ICD-10-CM | POA: Insufficient documentation

## 2011-04-01 DIAGNOSIS — M539 Dorsopathy, unspecified: Secondary | ICD-10-CM | POA: Insufficient documentation

## 2011-04-01 DIAGNOSIS — M542 Cervicalgia: Secondary | ICD-10-CM | POA: Insufficient documentation

## 2011-04-01 NOTE — Progress Notes (Signed)
Physical Therapy Treatment Patient Details  Name: William Gill MRN: [id number] Date of Birth: August 23, 1955  Today's Date: 04/01/2011 Time: 7829-5621 Time Calculation (min): 50 min Visit#: 7  of 8   Charges: Therex x 10' ROM x 1 MMT x 1 Ice x 10'  Subjective: Symptoms/Limitations Symptoms: "My pain is always the same, but at least it's not getting worse." Pain Assessment Currently in Pain?: Yes Pain Score:   3 Pain Location: Neck Pain Orientation: Left  Objective:  Cervical AROM  Cervical Flexion: decreased 10% (decreased 30%)  Cervical Extension: (wnl)  Cervical - Right Side Bend: decreased 40%  (decreased 50%)  Cervical - Left Side Bend: decreased 60% pain with motion (decreased 70%)  Cervical - Right Rotation: decreased 10%(decreased 10%)  Cervical - Left Rotation: decreased 30%(decreased 50%)  Cervical Strength  Cervical Flexion: 5/5 (3/5)  Cervical Extension: 5/5 (3/5)  Cervical - Right Side Bend: 5/5( 3/5)  Cervical - Left Side Bend: 5/5 (2+/5)  Exercise/Treatments Neck Exercises Neck Retraction: 20 reps W Back: 15 reps W Back Weights (lbs): 3 X to V: 15 reps X to V Weights (lbs): 3 Additional Neck Exercises UBE (Upper Arm Bike): 4'@2 .0 backward  Modalities Modalities: Cryotherapy Cryotherapy Number Minutes Cryotherapy: 10 Minutes Cryotherapy Location: Neck (Left) Type of Cryotherapy: Ice pack  Physical Therapy Assessment and Plan PT Assessment and Plan Clinical Impression Statement: Pt displays slight gains in ROM. Cervical strength has improved to WNL. Pt reports he is still limited by pain and has not seen any change in his pain since beginning therapy. PT Treatment/Interventions: Therapeutic exercise;Other (comment) (Ice; MMT; ROM) PT Plan: Recommend d/c to PT.    Goals PT Short Term Goals PT Short Term Goal 1 - Progress: Met PT Short Term Goal 2 - Progress: Not met PT Long Term Goals PT Long Term Goal 1 - Progress: Met PT Long Term Goal 2 -  Progress: Partly met Long Term Goal 3 Progress: Not met Long Term Goal 4 Progress: Not met  Problem List Patient Active Problem List  Diagnoses  . Stiffness of joints, not elsewhere classified, multiple sites  . Muscle weakness (generalized)    PT - End of Session Activity Tolerance: Patient tolerated treatment well General Behavior During Session: [facility] for tasks performed Cognition: [facility] for tasks performed  Antonieta Iba 04/01/2011, 4:59 PM

## 2011-04-03 ENCOUNTER — Ambulatory Visit (HOSPITAL_COMMUNITY): Payer: BC Managed Care – PPO | Admitting: *Deleted

## 2011-04-08 ENCOUNTER — Ambulatory Visit (HOSPITAL_COMMUNITY): Payer: BC Managed Care – PPO | Admitting: *Deleted

## 2011-04-09 ENCOUNTER — Ambulatory Visit (HOSPITAL_COMMUNITY): Payer: BC Managed Care – PPO | Admitting: Physical Therapy

## 2011-04-14 ENCOUNTER — Ambulatory Visit (HOSPITAL_COMMUNITY): Payer: BC Managed Care – PPO | Admitting: *Deleted

## 2011-04-16 ENCOUNTER — Other Ambulatory Visit (HOSPITAL_COMMUNITY): Payer: Self-pay | Admitting: Neurosurgery

## 2011-04-16 ENCOUNTER — Ambulatory Visit (HOSPITAL_COMMUNITY): Payer: BC Managed Care – PPO | Admitting: *Deleted

## 2011-04-16 DIAGNOSIS — M542 Cervicalgia: Secondary | ICD-10-CM

## 2011-04-21 ENCOUNTER — Ambulatory Visit (HOSPITAL_COMMUNITY)
Admission: RE | Admit: 2011-04-21 | Discharge: 2011-04-21 | Disposition: A | Discharge status: Home / Self Care | Payer: BC Managed Care – PPO | Source: Ambulatory Visit | Attending: Neurosurgery | Admitting: Neurosurgery

## 2011-04-21 ENCOUNTER — Other Ambulatory Visit (HOSPITAL_COMMUNITY): Payer: BC Managed Care – PPO

## 2011-04-21 DIAGNOSIS — M5380 Other specified dorsopathies, site unspecified: Secondary | ICD-10-CM | POA: Insufficient documentation

## 2011-04-21 DIAGNOSIS — M542 Cervicalgia: Secondary | ICD-10-CM

## 2011-04-21 DIAGNOSIS — Z981 Arthrodesis status: Secondary | ICD-10-CM | POA: Insufficient documentation

## 2011-04-21 DIAGNOSIS — R209 Unspecified disturbances of skin sensation: Secondary | ICD-10-CM | POA: Insufficient documentation

## 2012-10-25 ENCOUNTER — Other Ambulatory Visit (HOSPITAL_COMMUNITY): Payer: Self-pay | Admitting: Orthopedic Surgery

## 2012-10-25 DIAGNOSIS — R52 Pain, unspecified: Secondary | ICD-10-CM

## 2012-11-01 ENCOUNTER — Ambulatory Visit (HOSPITAL_COMMUNITY)
Admission: RE | Admit: 2012-11-01 | Discharge: 2012-11-01 | Disposition: A | Discharge status: Home / Self Care | Payer: BC Managed Care – PPO | Source: Ambulatory Visit | Attending: Orthopedic Surgery | Admitting: Orthopedic Surgery

## 2012-11-01 DIAGNOSIS — M19019 Primary osteoarthritis, unspecified shoulder: Secondary | ICD-10-CM | POA: Insufficient documentation

## 2012-11-01 DIAGNOSIS — M75100 Unspecified rotator cuff tear or rupture of unspecified shoulder, not specified as traumatic: Secondary | ICD-10-CM | POA: Insufficient documentation

## 2012-11-01 DIAGNOSIS — R52 Pain, unspecified: Secondary | ICD-10-CM

## 2012-11-01 DIAGNOSIS — IMO0002 Reserved for concepts with insufficient information to code with codable children: Secondary | ICD-10-CM | POA: Insufficient documentation

## 2012-11-01 DIAGNOSIS — M25519 Pain in unspecified shoulder: Secondary | ICD-10-CM | POA: Insufficient documentation

## 2013-01-26 ENCOUNTER — Other Ambulatory Visit: Payer: Self-pay | Admitting: Pediatrics

## 2013-01-26 ENCOUNTER — Other Ambulatory Visit (HOSPITAL_COMMUNITY): Payer: Self-pay | Admitting: Neurosurgery

## 2013-01-26 DIAGNOSIS — M48061 Spinal stenosis, lumbar region without neurogenic claudication: Secondary | ICD-10-CM

## 2013-01-31 ENCOUNTER — Other Ambulatory Visit (HOSPITAL_COMMUNITY): Payer: BC Managed Care – PPO

## 2013-02-01 ENCOUNTER — Ambulatory Visit (HOSPITAL_COMMUNITY)
Admission: RE | Admit: 2013-02-01 | Discharge: 2013-02-01 | Disposition: A | Discharge status: Home / Self Care | Payer: BC Managed Care – PPO | Source: Ambulatory Visit | Attending: Neurosurgery | Admitting: Neurosurgery

## 2013-02-01 DIAGNOSIS — M5380 Other specified dorsopathies, site unspecified: Secondary | ICD-10-CM | POA: Insufficient documentation

## 2013-02-01 DIAGNOSIS — M51379 Other intervertebral disc degeneration, lumbosacral region without mention of lumbar back pain or lower extremity pain: Secondary | ICD-10-CM | POA: Insufficient documentation

## 2013-02-01 DIAGNOSIS — M48061 Spinal stenosis, lumbar region without neurogenic claudication: Secondary | ICD-10-CM | POA: Insufficient documentation

## 2013-02-01 DIAGNOSIS — M5137 Other intervertebral disc degeneration, lumbosacral region: Secondary | ICD-10-CM | POA: Insufficient documentation

## 2013-02-01 MED ORDER — GADOBENATE DIMEGLUMINE 529 MG/ML IV SOLN
20.0000 mL | Freq: Once | INTRAVENOUS | Status: AC | PRN
  Administered 2013-02-01: 20 mL via INTRAVENOUS

## 2013-02-02 LAB — POCT I-STAT, CHEM 8
Calcium, Ion: 1.27 mmol/L — ABNORMAL HIGH (ref 1.12–1.23)
Chloride: 102 mEq/L (ref 96–112)
Creatinine, Ser: 1.1 mg/dL (ref 0.50–1.35)
Glucose, Bld: 114 mg/dL — ABNORMAL HIGH (ref 70–99)
Potassium: 4.4 mEq/L (ref 3.5–5.1)

## 2014-01-12 ENCOUNTER — Encounter (INDEPENDENT_AMBULATORY_CARE_PROVIDER_SITE_OTHER): Payer: Self-pay

## 2014-01-12 ENCOUNTER — Ambulatory Visit (HOSPITAL_COMMUNITY)
Admission: RE | Admit: 2014-01-12 | Discharge: 2014-01-12 | Disposition: A | Discharge status: Home / Self Care | Payer: BC Managed Care – PPO | Source: Ambulatory Visit | Attending: Internal Medicine | Admitting: Internal Medicine

## 2014-01-12 ENCOUNTER — Other Ambulatory Visit (HOSPITAL_COMMUNITY): Payer: Self-pay | Admitting: Internal Medicine

## 2014-01-12 DIAGNOSIS — R05 Cough: Secondary | ICD-10-CM

## 2014-01-12 DIAGNOSIS — I7090 Unspecified atherosclerosis: Secondary | ICD-10-CM | POA: Insufficient documentation

## 2014-01-12 DIAGNOSIS — R059 Cough, unspecified: Secondary | ICD-10-CM | POA: Insufficient documentation

## 2014-01-12 DIAGNOSIS — M47814 Spondylosis without myelopathy or radiculopathy, thoracic region: Secondary | ICD-10-CM | POA: Insufficient documentation

## 2014-02-09 ENCOUNTER — Encounter (HOSPITAL_COMMUNITY): Payer: Self-pay | Admitting: Pharmacy Technician

## 2014-02-12 ENCOUNTER — Encounter (HOSPITAL_COMMUNITY)
Admission: RE | Admit: 2014-02-12 | Discharge: 2014-02-12 | Disposition: A | Discharge status: Home / Self Care | Payer: BC Managed Care – PPO | Source: Ambulatory Visit | Attending: Orthopedic Surgery | Admitting: Orthopedic Surgery

## 2014-02-12 ENCOUNTER — Encounter (HOSPITAL_COMMUNITY): Payer: Self-pay

## 2014-02-12 DIAGNOSIS — Z01818 Encounter for other preprocedural examination: Secondary | ICD-10-CM | POA: Insufficient documentation

## 2014-02-12 DIAGNOSIS — M19019 Primary osteoarthritis, unspecified shoulder: Secondary | ICD-10-CM | POA: Diagnosis not present

## 2014-02-12 HISTORY — DX: Unspecified osteoarthritis, unspecified site: M19.90

## 2014-02-12 HISTORY — DX: Hyperlipidemia, unspecified: E78.5

## 2014-02-12 HISTORY — DX: Gastro-esophageal reflux disease without esophagitis: K21.9

## 2014-02-12 HISTORY — DX: Pain in unspecified joint: M25.50

## 2014-02-12 HISTORY — DX: Dorsalgia, unspecified: M54.9

## 2014-02-12 HISTORY — DX: Essential (primary) hypertension: I10

## 2014-02-12 HISTORY — DX: Personal history of other diseases of the respiratory system: Z87.09

## 2014-02-12 HISTORY — DX: Other chronic pain: G89.29

## 2014-02-12 LAB — CBC WITH DIFFERENTIAL/PLATELET
BASOS ABS: 0.1 10*3/uL (ref 0.0–0.1)
Basophils Relative: 1 % (ref 0–1)
EOS ABS: 0.3 10*3/uL (ref 0.0–0.7)
EOS PCT: 3 % (ref 0–5)
HCT: 37.4 % — ABNORMAL LOW (ref 39.0–52.0)
Hemoglobin: 12.7 g/dL — ABNORMAL LOW (ref 13.0–17.0)
Lymphocytes Relative: 29 % (ref 12–46)
Lymphs Abs: 2.5 10*3/uL (ref 0.7–4.0)
MCH: 29.1 pg (ref 26.0–34.0)
MCHC: 34 g/dL (ref 30.0–36.0)
MCV: 85.8 fL (ref 78.0–100.0)
Monocytes Absolute: 0.8 10*3/uL (ref 0.1–1.0)
Monocytes Relative: 9 % (ref 3–12)
Neutro Abs: 5 10*3/uL (ref 1.7–7.7)
Neutrophils Relative %: 58 % (ref 43–77)
PLATELETS: 352 10*3/uL (ref 150–400)
RBC: 4.36 MIL/uL (ref 4.22–5.81)
RDW: 14.3 % (ref 11.5–15.5)
WBC: 8.6 10*3/uL (ref 4.0–10.5)

## 2014-02-12 LAB — COMPREHENSIVE METABOLIC PANEL
ALT: 25 U/L (ref 0–53)
AST: 22 U/L (ref 0–37)
Albumin: 4.3 g/dL (ref 3.5–5.2)
Alkaline Phosphatase: 41 U/L (ref 39–117)
Anion gap: 12 (ref 5–15)
BUN: 21 mg/dL (ref 6–23)
CO2: 26 mEq/L (ref 19–32)
Calcium: 9.7 mg/dL (ref 8.4–10.5)
Chloride: 104 mEq/L (ref 96–112)
Creatinine, Ser: 1 mg/dL (ref 0.50–1.35)
GFR calc Af Amer: >90 mL/min (ref >90)
GFR calc non Af Amer: 82 mL/min — ABNORMAL LOW (ref >90)
Glucose, Bld: 106 mg/dL — ABNORMAL HIGH (ref 70–99)
Potassium: 4.8 mEq/L (ref 3.7–5.3)
SODIUM: 142 meq/L (ref 137–147)
TOTAL PROTEIN: 7 g/dL (ref 6.0–8.3)
Total Bilirubin: <0.2 mg/dL — ABNORMAL LOW (ref 0.3–1.2)

## 2014-02-12 LAB — PROTIME-INR
INR: 0.97 (ref 0.00–1.49)
Prothrombin Time: 12.9 seconds (ref 11.6–15.2)

## 2014-02-12 LAB — ABO/RH: ABO/RH(D): A POS

## 2014-02-12 LAB — TYPE AND SCREEN
ABO/RH(D): A POS
ANTIBODY SCREEN: NEGATIVE

## 2014-02-12 LAB — APTT: aPTT: 26 seconds (ref 24–37)

## 2014-02-12 MED ORDER — CHLORHEXIDINE GLUCONATE 4 % EX LIQD: 60.0000 mL | Freq: Once | CUTANEOUS | Status: DC

## 2014-02-12 NOTE — Pre-Procedure Instructions (Signed)
William Gill  02/12/2014   Your procedure is scheduled on:  Thurs, Sept 3 @ 7:30 AM  Report to [facility]  at 5:30 AM.  Call this number if you have problems the morning of surgery: [phone]   Remember:   Do not eat food or drink liquids after midnight.   Take these medicines the morning of surgery with A SIP OF WATER: Amlodipine(Norvasc),Atenolol(Tenormin),Pain Pill(if needed),and Omeprazole(Prilosec)              Stop taking your CO Q10 and Naproxen 7 days prior to surgery. No Goody's,BC's,Aspirin,Ibuprofen,or any Herbal Medications   Do not wear jewelry  Do not wear lotions, powders, or colognes.   Men may shave face and neck.  Do not bring valuables to the hospital.  [facility] is not responsible                  for any belongings or valuables.               Contacts, dentures or bridgework may not be worn into surgery.  Leave suitcase in the car. After surgery it may be brought to your room.  For patients admitted to the hospital, discharge time is determined by your                treatment team.               Special Instructions:  [place] - Preparing for Surgery  Before surgery, you can play an important role.  Because skin is not sterile, your skin needs to be as free of germs as possible.  You can reduce the number of germs on you skin by washing with CHG (chlorahexidine gluconate) soap before surgery.  CHG is an antiseptic cleaner which kills germs and bonds with the skin to continue killing germs even after washing.  Please DO NOT use if you have an allergy to CHG or antibacterial soaps.  If your skin becomes reddened/irritated stop using the CHG and inform your nurse when you arrive at Short Stay.  Do not shave (including legs and underarms) for at least 48 hours prior to the first CHG shower.  You may shave your face.  Please follow these instructions carefully:   1.  Shower with CHG Soap the night before surgery and the                                 morning of Surgery.  2.  If you choose to wash your hair, wash your hair first as usual with your       normal shampoo.  3.  After you shampoo, rinse your hair and body thoroughly to remove the                      Shampoo.  4.  Use CHG as you would any other liquid soap.  You can apply chg directly       to the skin and wash gently with scrungie or a clean washcloth.  5.  Apply the CHG Soap to your body ONLY FROM THE NECK DOWN.        Do not use on open wounds or open sores.  Avoid contact with your eyes,       ears, mouth and genitals (private parts).  Wash genitals (private parts)       with your normal soap.  6.  Wash thoroughly, paying special attention to the area where your surgery        will be performed.  7.  Thoroughly rinse your body with warm water from the neck down.  8.  DO NOT shower/wash with your normal soap after using and rinsing off       the CHG Soap.  9.  Pat yourself dry with a clean towel.            10.  Wear clean pajamas.            11.  Place clean sheets on your bed the night of your first shower and do not        sleep with pets.  Day of Surgery  Do not apply any lotions/deoderants the morning of surgery.  Please wear clean clothes to the hospital/surgery center.     Please read over the following fact sheets that you were given: Pain Booklet, Coughing and Deep Breathing, Blood Transfusion Information and Surgical Site Infection Prevention

## 2014-02-12 NOTE — Progress Notes (Addendum)
Anesthesia Chart Review:  Pt is 58 year old male posted for R total shoulder arthoplasty on 02/22/14 with Dr. Onnie Graham.   PMH: HTN, hyperlipidemia, GERD, arthritis, chronic back pain.   Medications include: amlodipine, atenolol, hctz, lisinopril, lipitor, tricor, prilosec, naproxen.   Preoperative labs reviewed.   Chest x-ray 01/12/14 reviewed.  No acute findings. Mild atherosclerosis and thoracic spondylosis.   EKG: NSR, incomplete bundle branch block, cannot rule out anteroseptal infarct (age undetermined).   Cardiac cath 2011:  -mild, nonobstructive coronary artery disease without hemodynamic significant lesion (only 20% mid and proximal OM stenosis) -normal LV function -no significant mitral regurgitation or aortic stenosis  Willeen Cass, FNP-BC [facility] Short Stay Surgical Center/Anesthesiology Phone: [phone] 02/12/2014 3:04 PM  Addendum:  Above note reviewed.  Comparison EKG obtained from [facility] from 04/05/01.  No significant change when compared to his 02/09/14 EKG.  The interpreting cardiologist Dr. Martinique also reviewed and felt EKGs were not significantly changed.  He had no significant CAD with only mild 20% mid and proximal OM stenosis) by cath < 5 years ago (10/10/2009 by Dr. Quincy Carnes, copy on chart). He is no longer followed by cardiology. PCP is listed as Dr. Orson Ape. No CV symptoms documented at his PAT visit.  He will be further evaluated by his assigned anesthesiologist, but if no acute changes or new CV symptoms then I would anticipate that he could proceed as planned.  George [facility] Short Stay Center/Anesthesiology Phone [phone] 02/13/2014 2:23 PM

## 2014-02-12 NOTE — Progress Notes (Signed)
Stress test done in 2011 and showed something abnormal and then followed by cath in 2011 and 2012 which were clear(stress test done at [facility] be requested)  Saw a cardiologist in 2011 and 2012 but hasn't had to see one any more  Denies EKG in past yr  CXR in epic from 01-12-14  [place]

## 2014-02-21 MED ORDER — CEFAZOLIN SODIUM-DEXTROSE 2-3 GM-% IV SOLR
2.0000 g | INTRAVENOUS | Status: AC
  Administered 2014-02-22: 2 g via INTRAVENOUS
  Filled 2014-02-21: qty 50

## 2014-02-21 MED ORDER — LACTATED RINGERS IV SOLN
INTRAVENOUS | Status: DC
  Administered 2014-02-22: 07:00:00 via INTRAVENOUS

## 2014-02-22 ENCOUNTER — Ambulatory Visit (HOSPITAL_COMMUNITY): Payer: BC Managed Care – PPO | Admitting: Anesthesiology

## 2014-02-22 ENCOUNTER — Encounter (HOSPITAL_COMMUNITY): Payer: BC Managed Care – PPO | Admitting: Emergency Medicine

## 2014-02-22 ENCOUNTER — Inpatient Hospital Stay (HOSPITAL_COMMUNITY)
Admission: RE | Admit: 2014-02-22 | Discharge: 2014-02-23 | DRG: 483 | Disposition: A | Discharge status: Home / Self Care | Payer: BC Managed Care – PPO | Source: Ambulatory Visit | Attending: Orthopedic Surgery | Admitting: Orthopedic Surgery

## 2014-02-22 ENCOUNTER — Encounter (HOSPITAL_COMMUNITY)
Admission: RE | Disposition: A | Discharge status: Home / Self Care | Payer: Self-pay | Source: Ambulatory Visit | Attending: Orthopedic Surgery

## 2014-02-22 ENCOUNTER — Encounter (HOSPITAL_COMMUNITY): Payer: Self-pay | Admitting: Surgery

## 2014-02-22 DIAGNOSIS — I10 Essential (primary) hypertension: Secondary | ICD-10-CM | POA: Diagnosis present

## 2014-02-22 DIAGNOSIS — Z87891 Personal history of nicotine dependence: Secondary | ICD-10-CM | POA: Diagnosis not present

## 2014-02-22 DIAGNOSIS — K219 Gastro-esophageal reflux disease without esophagitis: Secondary | ICD-10-CM | POA: Diagnosis present

## 2014-02-22 DIAGNOSIS — E785 Hyperlipidemia, unspecified: Secondary | ICD-10-CM | POA: Diagnosis present

## 2014-02-22 DIAGNOSIS — G8929 Other chronic pain: Secondary | ICD-10-CM | POA: Diagnosis present

## 2014-02-22 DIAGNOSIS — M25519 Pain in unspecified shoulder: Secondary | ICD-10-CM | POA: Diagnosis present

## 2014-02-22 DIAGNOSIS — Z96619 Presence of unspecified artificial shoulder joint: Secondary | ICD-10-CM

## 2014-02-22 DIAGNOSIS — M19011 Primary osteoarthritis, right shoulder: Secondary | ICD-10-CM

## 2014-02-22 DIAGNOSIS — Z981 Arthrodesis status: Secondary | ICD-10-CM | POA: Diagnosis not present

## 2014-02-22 DIAGNOSIS — M19019 Primary osteoarthritis, unspecified shoulder: Secondary | ICD-10-CM | POA: Diagnosis present

## 2014-02-22 DIAGNOSIS — M549 Dorsalgia, unspecified: Secondary | ICD-10-CM | POA: Diagnosis present

## 2014-02-22 HISTORY — PX: TOTAL SHOULDER ARTHROPLASTY: SHX126

## 2014-02-22 SURGERY — ARTHROPLASTY, SHOULDER, TOTAL
Anesthesia: General | Site: Shoulder | Laterality: Right

## 2014-02-22 MED ORDER — OXYCODONE HCL 5 MG PO TABS
ORAL_TABLET | ORAL | Status: AC
  Filled 2014-02-22: qty 1

## 2014-02-22 MED ORDER — MIDAZOLAM HCL 2 MG/2ML IJ SOLN
INTRAMUSCULAR | Status: AC
  Filled 2014-02-22: qty 2

## 2014-02-22 MED ORDER — ONDANSETRON HCL 4 MG PO TABS: 4.0000 mg | ORAL_TABLET | Freq: Four times a day (QID) | ORAL | Status: DC | PRN

## 2014-02-22 MED ORDER — FENOFIBRATE 160 MG PO TABS
160.0000 mg | ORAL_TABLET | Freq: Every day | ORAL | Status: DC
  Administered 2014-02-23: 160 mg via ORAL
  Filled 2014-02-22 (×2): qty 1

## 2014-02-22 MED ORDER — KETOROLAC TROMETHAMINE 15 MG/ML IJ SOLN
15.0000 mg | Freq: Four times a day (QID) | INTRAMUSCULAR | Status: DC
  Administered 2014-02-22 – 2014-02-23 (×4): 15 mg via INTRAVENOUS
  Filled 2014-02-22 (×8): qty 1

## 2014-02-22 MED ORDER — DIPHENHYDRAMINE HCL 12.5 MG/5ML PO ELIX: 12.5000 mg | ORAL_SOLUTION | ORAL | Status: DC | PRN

## 2014-02-22 MED ORDER — PHENOL 1.4 % MT LIQD: 1.0000 | OROMUCOSAL | Status: DC | PRN

## 2014-02-22 MED ORDER — NEOSTIGMINE METHYLSULFATE 10 MG/10ML IV SOLN
INTRAVENOUS | Status: DC | PRN
  Administered 2014-02-22: 3 mg via INTRAVENOUS

## 2014-02-22 MED ORDER — BISACODYL 5 MG PO TBEC: 5.0000 mg | DELAYED_RELEASE_TABLET | Freq: Every day | ORAL | Status: DC | PRN

## 2014-02-22 MED ORDER — GLYCOPYRROLATE 0.2 MG/ML IJ SOLN
INTRAMUSCULAR | Status: DC | PRN
  Administered 2014-02-22: 0.2 mg via INTRAVENOUS
  Administered 2014-02-22: 0.4 mg via INTRAVENOUS

## 2014-02-22 MED ORDER — LIDOCAINE HCL (CARDIAC) 20 MG/ML IV SOLN
INTRAVENOUS | Status: AC
  Filled 2014-02-22: qty 5

## 2014-02-22 MED ORDER — ACETAMINOPHEN 650 MG RE SUPP: 650.0000 mg | Freq: Four times a day (QID) | RECTAL | Status: DC | PRN

## 2014-02-22 MED ORDER — OXYCODONE HCL 5 MG PO TABS
5.0000 mg | ORAL_TABLET | Freq: Once | ORAL | Status: AC | PRN
  Administered 2014-02-22: 5 mg via ORAL

## 2014-02-22 MED ORDER — SODIUM CHLORIDE 0.9 % IV SOLN
10.0000 mg | INTRAVENOUS | Status: DC | PRN
  Administered 2014-02-22: 10 ug/min via INTRAVENOUS

## 2014-02-22 MED ORDER — OXYCODONE HCL 5 MG/5ML PO SOLN: 5.0000 mg | Freq: Once | ORAL | Status: AC | PRN

## 2014-02-22 MED ORDER — LISINOPRIL 10 MG PO TABS
10.0000 mg | ORAL_TABLET | Freq: Every evening | ORAL | Status: DC
  Administered 2014-02-22: 10 mg via ORAL
  Filled 2014-02-22 (×2): qty 1

## 2014-02-22 MED ORDER — DIAZEPAM 5 MG PO TABS: 5.0000 mg | ORAL_TABLET | Freq: Four times a day (QID) | ORAL | Status: DC | PRN

## 2014-02-22 MED ORDER — HYDROCHLOROTHIAZIDE 12.5 MG PO CAPS
12.5000 mg | ORAL_CAPSULE | Freq: Every day | ORAL | Status: DC
  Administered 2014-02-22 – 2014-02-23 (×2): 12.5 mg via ORAL
  Filled 2014-02-22 (×2): qty 1

## 2014-02-22 MED ORDER — ONDANSETRON HCL 4 MG/2ML IJ SOLN
INTRAMUSCULAR | Status: DC | PRN
  Administered 2014-02-22: 4 mg via INTRAVENOUS

## 2014-02-22 MED ORDER — ONDANSETRON HCL 4 MG/2ML IJ SOLN: 4.0000 mg | Freq: Four times a day (QID) | INTRAMUSCULAR | Status: DC | PRN

## 2014-02-22 MED ORDER — DEXAMETHASONE SODIUM PHOSPHATE 4 MG/ML IJ SOLN
INTRAMUSCULAR | Status: AC
  Filled 2014-02-22: qty 1

## 2014-02-22 MED ORDER — ROCURONIUM BROMIDE 100 MG/10ML IV SOLN
INTRAVENOUS | Status: DC | PRN
  Administered 2014-02-22: 40 mg via INTRAVENOUS

## 2014-02-22 MED ORDER — METOCLOPRAMIDE HCL 10 MG PO TABS: 5.0000 mg | ORAL_TABLET | Freq: Three times a day (TID) | ORAL | Status: DC | PRN

## 2014-02-22 MED ORDER — PROPOFOL 10 MG/ML IV BOLUS
INTRAVENOUS | Status: DC | PRN
  Administered 2014-02-22: 150 mg via INTRAVENOUS

## 2014-02-22 MED ORDER — ACETAMINOPHEN 325 MG PO TABS: 650.0000 mg | ORAL_TABLET | Freq: Four times a day (QID) | ORAL | Status: DC | PRN

## 2014-02-22 MED ORDER — FENTANYL CITRATE 0.05 MG/ML IJ SOLN
INTRAMUSCULAR | Status: AC
  Filled 2014-02-22: qty 5

## 2014-02-22 MED ORDER — SODIUM CHLORIDE 0.9 % IR SOLN
Status: DC | PRN
Start: 2014-02-22 — End: 2014-02-22
  Administered 2014-02-22: 1000 mL

## 2014-02-22 MED ORDER — ATORVASTATIN CALCIUM 40 MG PO TABS
40.0000 mg | ORAL_TABLET | Freq: Every day | ORAL | Status: DC
  Administered 2014-02-22 – 2014-02-23 (×2): 40 mg via ORAL
  Filled 2014-02-22 (×2): qty 1

## 2014-02-22 MED ORDER — AMLODIPINE BESYLATE 10 MG PO TABS
10.0000 mg | ORAL_TABLET | ORAL | Status: DC
  Filled 2014-02-22 (×2): qty 1

## 2014-02-22 MED ORDER — MIDAZOLAM HCL 5 MG/5ML IJ SOLN
INTRAMUSCULAR | Status: DC | PRN
  Administered 2014-02-22: 2 mg via INTRAVENOUS

## 2014-02-22 MED ORDER — FENTANYL CITRATE 0.05 MG/ML IJ SOLN
INTRAMUSCULAR | Status: DC | PRN
  Administered 2014-02-22 (×4): 50 ug via INTRAVENOUS

## 2014-02-22 MED ORDER — BUPIVACAINE-EPINEPHRINE (PF) 0.5% -1:200000 IJ SOLN
INTRAMUSCULAR | Status: DC | PRN
  Administered 2014-02-22: 30 mL via PERINEURAL

## 2014-02-22 MED ORDER — PROMETHAZINE HCL 25 MG/ML IJ SOLN: 6.2500 mg | INTRAMUSCULAR | Status: DC | PRN

## 2014-02-22 MED ORDER — HYDROMORPHONE HCL PF 1 MG/ML IJ SOLN
INTRAMUSCULAR | Status: AC
  Filled 2014-02-22: qty 1

## 2014-02-22 MED ORDER — FLEET ENEMA 7-19 GM/118ML RE ENEM: 1.0000 | ENEMA | Freq: Once | RECTAL | Status: AC | PRN

## 2014-02-22 MED ORDER — GLYCOPYRROLATE 0.2 MG/ML IJ SOLN
INTRAMUSCULAR | Status: AC
  Filled 2014-02-22: qty 3

## 2014-02-22 MED ORDER — PANTOPRAZOLE SODIUM 40 MG PO TBEC
40.0000 mg | DELAYED_RELEASE_TABLET | Freq: Every day | ORAL | Status: DC
  Administered 2014-02-22 – 2014-02-23 (×2): 40 mg via ORAL
  Filled 2014-02-22 (×2): qty 1

## 2014-02-22 MED ORDER — MIDAZOLAM HCL 2 MG/2ML IJ SOLN: 0.5000 mg | Freq: Once | INTRAMUSCULAR | Status: DC | PRN

## 2014-02-22 MED ORDER — MEPERIDINE HCL 25 MG/ML IJ SOLN: 6.2500 mg | INTRAMUSCULAR | Status: DC | PRN

## 2014-02-22 MED ORDER — SODIUM CHLORIDE 0.9 % IJ SOLN
INTRAMUSCULAR | Status: AC
  Filled 2014-02-22: qty 10

## 2014-02-22 MED ORDER — LACTATED RINGERS IV SOLN
INTRAVENOUS | Status: DC
  Administered 2014-02-22 (×2): via INTRAVENOUS

## 2014-02-22 MED ORDER — DOCUSATE SODIUM 100 MG PO CAPS
100.0000 mg | ORAL_CAPSULE | Freq: Two times a day (BID) | ORAL | Status: DC
  Administered 2014-02-22 – 2014-02-23 (×3): 100 mg via ORAL
  Filled 2014-02-22 (×4): qty 1

## 2014-02-22 MED ORDER — HYDROCHLOROTHIAZIDE 25 MG PO TABS
12.5000 mg | ORAL_TABLET | ORAL | Status: DC
  Filled 2014-02-22: qty 0.5

## 2014-02-22 MED ORDER — HYDROMORPHONE HCL PF 1 MG/ML IJ SOLN: 0.2500 mg | INTRAMUSCULAR | Status: DC | PRN

## 2014-02-22 MED ORDER — MENTHOL 3 MG MT LOZG: 1.0000 | LOZENGE | OROMUCOSAL | Status: DC | PRN

## 2014-02-22 MED ORDER — METOCLOPRAMIDE HCL 5 MG/ML IJ SOLN: 5.0000 mg | Freq: Three times a day (TID) | INTRAMUSCULAR | Status: DC | PRN

## 2014-02-22 MED ORDER — EPHEDRINE SULFATE 50 MG/ML IJ SOLN
INTRAMUSCULAR | Status: DC | PRN
  Administered 2014-02-22 (×4): 7.5 mg via INTRAVENOUS

## 2014-02-22 MED ORDER — HYDROMORPHONE HCL PF 1 MG/ML IJ SOLN
0.2500 mg | INTRAMUSCULAR | Status: DC | PRN
  Administered 2014-02-22 (×4): 0.5 mg via INTRAVENOUS

## 2014-02-22 MED ORDER — DEXAMETHASONE SODIUM PHOSPHATE 4 MG/ML IJ SOLN
INTRAMUSCULAR | Status: DC | PRN
  Administered 2014-02-22: 4 mg via INTRAVENOUS

## 2014-02-22 MED ORDER — EPHEDRINE SULFATE 50 MG/ML IJ SOLN
INTRAMUSCULAR | Status: AC
  Filled 2014-02-22: qty 1

## 2014-02-22 MED ORDER — NEOSTIGMINE METHYLSULFATE 10 MG/10ML IV SOLN
INTRAVENOUS | Status: AC
  Filled 2014-02-22: qty 1

## 2014-02-22 MED ORDER — ATENOLOL 50 MG PO TABS
50.0000 mg | ORAL_TABLET | Freq: Two times a day (BID) | ORAL | Status: DC
  Administered 2014-02-22 – 2014-02-23 (×3): 50 mg via ORAL
  Filled 2014-02-22 (×4): qty 1

## 2014-02-22 MED ORDER — OXYCODONE-ACETAMINOPHEN 5-325 MG PO TABS
1.0000 | ORAL_TABLET | ORAL | Status: DC | PRN
  Administered 2014-02-22 – 2014-02-23 (×5): 2 via ORAL
  Filled 2014-02-22 (×5): qty 2

## 2014-02-22 MED ORDER — CEFAZOLIN SODIUM 1-5 GM-% IV SOLN
1.0000 g | Freq: Four times a day (QID) | INTRAVENOUS | Status: AC
  Administered 2014-02-22 – 2014-02-23 (×3): 1 g via INTRAVENOUS
  Filled 2014-02-22 (×3): qty 50

## 2014-02-22 MED ORDER — ALUM & MAG HYDROXIDE-SIMETH 200-200-20 MG/5ML PO SUSP: 30.0000 mL | ORAL | Status: DC | PRN

## 2014-02-22 MED ORDER — LIDOCAINE HCL (CARDIAC) 20 MG/ML IV SOLN
INTRAVENOUS | Status: DC | PRN
  Administered 2014-02-22: 20 mg via INTRAVENOUS

## 2014-02-22 MED ORDER — POLYETHYLENE GLYCOL 3350 17 G PO PACK: 17.0000 g | PACK | Freq: Every day | ORAL | Status: DC | PRN

## 2014-02-22 SURGICAL SUPPLY — 69 items
ADH SKN CLS APL DERMABOND .7 (GAUZE/BANDAGES/DRESSINGS) ×1
BLADE SAW SGTL 83.5X18.5 (BLADE) ×3 IMPLANT
BODY PROX SHOULDER SZ 14-135 (Shoulder) ×2 IMPLANT
CEMENT BONE DEPUY (Cement) ×2 IMPLANT
CLOSURE WOUND 1/2 X4 (GAUZE/BANDAGES/DRESSINGS)
COVER SURGICAL LIGHT HANDLE (MISCELLANEOUS) ×3 IMPLANT
DERMABOND ADVANCED (GAUZE/BANDAGES/DRESSINGS) ×2
DERMABOND ADVANCED .7 DNX12 (GAUZE/BANDAGES/DRESSINGS) IMPLANT
DRAPE INCISE IOBAN 66X45 STRL (DRAPES) ×3 IMPLANT
DRAPE SURG 17X11 SM STRL (DRAPES) ×3 IMPLANT
DRAPE SURG 17X23 STRL (DRAPES) ×3 IMPLANT
DRAPE U-SHAPE 47X51 STRL (DRAPES) ×3 IMPLANT
DRILL BIT 7/64X5 (BIT) IMPLANT
DRSG AQUACEL AG ADV 3.5X10 (GAUZE/BANDAGES/DRESSINGS) ×3 IMPLANT
DRSG MEPILEX BORDER 4X4 (GAUZE/BANDAGES/DRESSINGS) ×1 IMPLANT
DRSG MEPILEX BORDER 4X8 (GAUZE/BANDAGES/DRESSINGS) ×1 IMPLANT
DURAPREP 26ML APPLICATOR (WOUND CARE) ×6 IMPLANT
ELECT BLADE 4.0 EZ CLEAN MEGAD (MISCELLANEOUS) ×3
ELECT CAUTERY BLADE 6.4 (BLADE) ×3 IMPLANT
ELECT REM PT RETURN 9FT ADLT (ELECTROSURGICAL) ×3
ELECTRODE BLDE 4.0 EZ CLN MEGD (MISCELLANEOUS) ×1 IMPLANT
ELECTRODE REM PT RTRN 9FT ADLT (ELECTROSURGICAL) ×1 IMPLANT
FACESHIELD WRAPAROUND (MASK) ×6 IMPLANT
FACESHIELD WRAPAROUND OR TEAM (MASK) ×3 IMPLANT
GLENOID ANCHOR PEG CROSSLK 48 (Orthopedic Implant) ×2 IMPLANT
GLOVE BIO SURGEON STRL SZ7.5 (GLOVE) ×3 IMPLANT
GLOVE BIO SURGEON STRL SZ8 (GLOVE) ×3 IMPLANT
GLOVE EUDERMIC 7 POWDERFREE (GLOVE) ×3 IMPLANT
GLOVE SS BIOGEL STRL SZ 7.5 (GLOVE) ×1 IMPLANT
GLOVE SUPERSENSE BIOGEL SZ 7.5 (GLOVE) ×2
GOWN STRL REUS W/ TWL LRG LVL3 (GOWN DISPOSABLE) ×1 IMPLANT
GOWN STRL REUS W/ TWL XL LVL3 (GOWN DISPOSABLE) ×2 IMPLANT
GOWN STRL REUS W/TWL LRG LVL3 (GOWN DISPOSABLE) ×3
GOWN STRL REUS W/TWL XL LVL3 (GOWN DISPOSABLE) ×6
HEAD HUMERAL ECCEN 52MX21M (Head) ×2 IMPLANT
KIT BASIN OR (CUSTOM PROCEDURE TRAY) ×3 IMPLANT
KIT ROOM TURNOVER OR (KITS) ×3 IMPLANT
MANIFOLD NEPTUNE II (INSTRUMENTS) ×3 IMPLANT
NDL HYPO 25GX1X1/2 BEV (NEEDLE) IMPLANT
NDL SUT 6 .5 CRC .975X.05 MAYO (NEEDLE) ×1 IMPLANT
NEEDLE HYPO 25GX1X1/2 BEV (NEEDLE) IMPLANT
NEEDLE MAYO TAPER (NEEDLE) ×3
NS IRRIG 1000ML POUR BTL (IV SOLUTION) ×3 IMPLANT
PACK SHOULDER (CUSTOM PROCEDURE TRAY) ×3 IMPLANT
PAD ARMBOARD 7.5X6 YLW CONV (MISCELLANEOUS) ×6 IMPLANT
PASSER SUT SWANSON 36MM LOOP (INSTRUMENTS) IMPLANT
PIN METAGLENE 2.5 (PIN) ×2 IMPLANT
SLING ARM FOAM STRAP XLG (SOFTGOODS) ×2 IMPLANT
SLING ARM LRG ADULT FOAM STRAP (SOFTGOODS) IMPLANT
SLING ARM XL FOAM STRAP (SOFTGOODS) ×3 IMPLANT
SMARTMIX MINI TOWER (MISCELLANEOUS) ×3
SPONGE LAP 18X18 X RAY DECT (DISPOSABLE) ×3 IMPLANT
SPONGE LAP 4X18 X RAY DECT (DISPOSABLE) ×3 IMPLANT
STEM GLOBAL UNITE 14MM PORO 11 (Joint) ×2 IMPLANT
STRIP CLOSURE SKIN 1/2X4 (GAUZE/BANDAGES/DRESSINGS) ×1 IMPLANT
SUCTION FRAZIER TIP 10 FR DISP (SUCTIONS) ×3 IMPLANT
SUT BONE WAX W31G (SUTURE) IMPLANT
SUT FIBERWIRE #2 38 T-5 BLUE (SUTURE) ×6
SUT MNCRL AB 3-0 PS2 18 (SUTURE) ×3 IMPLANT
SUT VIC AB 1 CT1 27 (SUTURE) ×9
SUT VIC AB 1 CT1 27XBRD ANBCTR (SUTURE) ×3 IMPLANT
SUT VIC AB 2-0 CT1 27 (SUTURE) ×6
SUT VIC AB 2-0 CT1 TAPERPNT 27 (SUTURE) ×2 IMPLANT
SUTURE FIBERWR #2 38 T-5 BLUE (SUTURE) ×2 IMPLANT
SYR CONTROL 10ML LL (SYRINGE) IMPLANT
TOWEL OR 17X24 6PK STRL BLUE (TOWEL DISPOSABLE) ×3 IMPLANT
TOWEL OR 17X26 10 PK STRL BLUE (TOWEL DISPOSABLE) ×3 IMPLANT
TOWER SMARTMIX MINI (MISCELLANEOUS) ×1 IMPLANT
WATER STERILE IRR 1000ML POUR (IV SOLUTION) ×1 IMPLANT

## 2014-02-22 NOTE — Op Note (Signed)
NAME:  William Gill, William Gill NO.:  [id number]  MEDICAL RECORD NO.:  [id number]  LOCATION:  [facility]                         FACILITY:  [facility]  PHYSICIAN:  Metta Clines. Tobi Groesbeck, M.D.  DATE OF BIRTH:  07/31/1955  DATE OF PROCEDURE:  02/22/2014 DATE OF DISCHARGE:                              OPERATIVE REPORT   PREOPERATIVE DIAGNOSIS:  End-stage right shoulder osteoarthritis.  POSTOPERATIVE DIAGNOSIS:  End-stage right shoulder osteoarthritis.  PROCEDURE:  A right total shoulder arthroplasty utilizing a DePuy press- fit modular stem size 14, a 52 x 21 eccentric head and a cemented pegged 48 glenoid.  SURGEON:  Metta Clines. Cass Vandermeulen, M.D.  William Gill, P.A.-C.  ANESTHESIA:  General endotracheal as well as an interscalene block.  ESTIMATED BLOOD LOSS:  250 mL.  DRAINS:  None.  HISTORY:  [name] is a 58 year old gentleman with a chronic and progressive increasing right shoulder pain related to end-stage osteoarthritis.  His symptoms have progressed despite prolonged attempts at conservative management.  He is now brought to the operating room for planned right total shoulder arthroplasty.  Preoperatively, I counseled [name] regarding treatment options and as well as risks versus benefits thereof.  Possible surgical complications were all reviewed including potential for bleeding, infection, neurovascular injury, persistent pain, loss of motion, anesthetic complication, failure of the implant, possible need for additional surgery.  He understands and accepts and agrees with our planned procedure.  PROCEDURE IN DETAIL:  After undergoing routine preop evaluation, the patient received prophylactic antibiotics and an interscalene block was established in the holding area by the Anesthesia Department.  Placed supine on operative table, underwent smooth induction of a general endotracheal anesthesia.  Placed in beach-chair position and appropriately padded and  protected.  The right shoulder girdle region was sterilely prepped and draped in standard fashion.  A time-out was called.  An anterior approach to the right shoulder made through deltopectoral incision approximately 10 cm in length since beginning at the coracoid process and extending laterally and distally.  Skin flaps were elevated.  Electrocautery was used for hemostasis.  The deltopectoral interval was identified.  Cephalic vein retracted laterally to the deltoid and this was developed from proximal to distal. The upper centimeter of thick major was tenotomized to enhance exposure. The conjoined tendon was then identified, mobilized, and retracted medially and adhesions beneath the deltoid were also divided.  Self- retaining retractors were then placed.  The biceps tendon was then unroofed and tenotomized for later tenodesis, then the rotator cuff was split along the rotator interval from the apex of the bicipital groove to the base of the coracoid.  We then dissected the subscapularis away from the lesser tuberosity leaving a 1 cm cuff of tissue for later repair and then tagged the free margin with a series of #2 FiberWire sutures.  We then released the capsular tissues from the anteroinferior and inferior aspect of the humeral head, exposing a very large inferior osteophyte and delivering the humeral head through the wound.  Using the extramedullary guide, outlined a proposed humeral head resection and this was performed, followed the anatomic version and humeral head was then excised carefully protecting the rotator  cuff superiorly and posteriorly.  I then used an osteotome to remove the large osteophyte on the anterior and inferior aspects of the humeral head.  At this point, gained access to the humeral medullary canal with a series of hand reamers and reamed up to size 14.  We then broached with a size 14 broach for the modular DePuy stem.  At this point, the metal cap was then  placed in the cut surface of the proximal humerus.  At this point, we gained exposure of the glenoid with combination of Fukuda, pitchfork, and snake tongue retractors.  Performed a circumferential labral resection, also removing the retained proximal aspect of the biceps tendon.  Mobilized the scapularis completely, gained circumferential exposure of the glenoid and did find some moderate retroversion.  A guide pin was then placed into the center of the glenoid and a reamed with a 48 reamer obtaining a stable subchondral bony bed.  I did correct the retroversion to alignment.  At this point, central drill hole was placed followed by the peripheral peg holes.  The glenoid was irrigated. The residual purple bone and soft tissue was removed with rongeur as we had stable base with glenoid implantation.  Cement was mixed in the appropriate consistency.  It was introduced into the peripheral peg holes.  We impacted a 48 glenoid with excellent fit and was then impacted and held until the cement had completely hardened.  At this point, we then returned our attention to the proximal humerus where we performed trial reductions after the trial 14 stem.  Once this was completed, we went ahead and seated the final all modular 14 stem to the appropriate level obtaining excellent fit and fixation.  I then performed a series of trial reductions and ultimately found that the 52 x 21 eccentric head gave [place] the best coverage of the proximal humerus and good soft tissue balance.  The final 52 x 21 head was impacted, after the [facility] taper was meticulously cleaned and dried.  Final reduction showed excellent soft tissue balance, good stability of the shoulder.  The joint was copiously irrigated.  The subscapularis was then repaired back to the lesser tuberosity with a #2 FiberWire and also closed the rotator interval with a pair of #2 FiberWire figure-of-eight sutures.  The biceps tendon was then tenodesed at  the level of the pectoralis major with a #2 FiberWires.  At this point, shoulder taken through range of range of motion showed excellent stability, good motion.  Wound was irrigated.  Hemostasis was obtained.  The deltopectoral interval was then reapproximated with figure-of-eight #1 Vicryl sutures.  2-0 Vicryl used for the subcu layer and intracuticular, 3-0 Monocryl for the skin followed by Steri-Strips.  Dry dressing was applied.  Right arm was placed in sling and the patient was awakened, extubated, and taken to recovery room in stable condition.  Jenetta Loges, PA-C was used as an Environmental consultant throughout this case, essential for help with positioning of the patient, positioning of the extremity, management of the retractors, tissue manipulation, wound closer, and intraoperative decision making.     Metta Clines. Airabella Barley, M.D.     KMS/MEDQ  D:  02/22/2014  T:  02/22/2014  Job:  [id number]

## 2014-02-22 NOTE — Anesthesia Postprocedure Evaluation (Signed)
  Anesthesia Post-op Note  Patient: William Gill  Procedure(s) Performed: Procedure(s): RIGHT TOTAL SHOULDER ARTHROPLASTY (Right)  Patient Location: PACU  Anesthesia Type:GA combined with regional for post-op pain  Level of Consciousness: awake, alert  and oriented  Airway and Oxygen Therapy: Patient Spontanous Breathing  Post-op Pain: none  Post-op Assessment: Post-op Vital signs reviewed  Post-op Vital Signs: Reviewed  Last Vitals:  Filed Vitals:   02/22/14 1300  BP: 113/63  Pulse: 83  Temp: 36.6 C  Resp: 18    Complications: No apparent anesthesia complications

## 2014-02-22 NOTE — Anesthesia Procedure Notes (Addendum)
Procedure Name: Intubation Date/Time: 02/22/2014 7:37 AM Performed by: Kyung Rudd Pre-anesthesia Checklist: Patient identified, Emergency Drugs available, Suction available, Patient being monitored and Timeout performed Patient Re-evaluated:Patient Re-evaluated prior to inductionOxygen Delivery Method: Circle system utilized Preoxygenation: Pre-oxygenation with 100% oxygen Intubation Type: IV induction Ventilation: Mask ventilation without difficulty Laryngoscope Size: Mac and 3 Grade View: Grade I Tube type: Oral Tube size: 7.5 mm Number of attempts: 1 Airway Equipment and Method: Stylet Placement Confirmation: ETT inserted through vocal cords under direct vision,  positive ETCO2 and breath sounds checked- equal and bilateral Secured at: 21 cm Tube secured with: Tape Dental Injury: Teeth and Oropharynx as per pre-operative assessment    Anesthesia Regional Block:  Interscalene brachial plexus block  Pre-Anesthetic Checklist: ,, timeout performed, Correct Patient, Correct Site, Correct Laterality, Correct Procedure, Correct Position, site marked, Risks and benefits discussed,  Surgical consent,  Pre-op evaluation,  At surgeon's request and post-op pain management  Laterality: Right and Upper  Prep: chloraprep       Needles:  Injection technique: Single-shot  Needle Type: Echogenic Stimulator Needle     Needle Length: 4cm 4 cm Needle Gauge: 22 and 22 G    Additional Needles:  Procedures: nerve stimulator Interscalene brachial plexus block  Nerve Stimulator or Paresthesia:  Response: forearm twitch, 0.45 mA, 0.1 ms,   Additional Responses:   Narrative:  Start time: 02/22/2014 7:15 AM End time: 02/22/2014 7:21 AM Injection made incrementally with aspirations every 5 mL.  Performed by: Personally  Anesthesiologist: Jenita Seashore, MD  Additional Notes: Pt identified in Holding room.  Monitors applied. Working IV access confirmed. Sterile prep R neck.  #22ga PNS to forearm  twitch at 0.40mA threshold.  30cc 0.5% Bupivacaine with 1:200k epi injected incrementally after negative test dose.  Patient asymptomatic, VSS, no heme aspirated, tolerated well.  Jenita Seashore, MD

## 2014-02-22 NOTE — Anesthesia Preprocedure Evaluation (Addendum)
Anesthesia Evaluation  Patient identified by MRN, date of birth, ID band Patient awake    Reviewed: Allergy & Precautions, H&P , NPO status , Patient's Chart, lab work & pertinent test results, reviewed documented beta blocker date and time   History of Anesthesia Complications Negative for: history of anesthetic complications  Airway Mallampati: II TM Distance: >3 FB Neck ROM: Full    Dental  (+) Teeth Intact, Chipped, Dental Advisory Given   Pulmonary former smoker,  breath sounds clear to auscultation  Pulmonary exam normal       Cardiovascular hypertension, Pt. on medications and Pt. on home beta blockers Rhythm:Regular Rate:Normal  '11 cath: no obstructive coronary disease   Neuro/Psych negative neurological ROS     GI/Hepatic Neg liver ROS, GERD-  Controlled and Medicated,  Endo/Other  negative endocrine ROS  Renal/GU negative Renal ROS     Musculoskeletal   Abdominal   Peds  Hematology   Anesthesia Other Findings   Reproductive/Obstetrics                        Anesthesia Physical Anesthesia Plan  ASA: II  Anesthesia Plan: General   Post-op Pain Management:    Induction: Intravenous  Airway Management Planned: Oral ETT  Additional Equipment:   Intra-op Plan:   Post-operative Plan: Extubation in OR  Informed Consent: I have reviewed the patients History and Physical, chart, labs and discussed the procedure including the risks, benefits and alternatives for the proposed anesthesia with the patient or authorized representative who has indicated his/her understanding and acceptance.   Dental advisory given  Plan Discussed with: CRNA, Anesthesiologist and Surgeon  Anesthesia Plan Comments: (Plan routine monitors, GETA with interscalene block for post op analgesia)       Anesthesia Quick Evaluation

## 2014-02-22 NOTE — Transfer of Care (Signed)
Immediate Anesthesia Transfer of Care Note  Patient: William Gill  Procedure(s) Performed: Procedure(s): RIGHT TOTAL SHOULDER ARTHROPLASTY (Right)  Patient Location: PACU  Anesthesia Type:General  Level of Consciousness: awake, alert  and oriented  Airway & Oxygen Therapy: Patient Spontanous Breathing and Patient connected to nasal cannula oxygen  Post-op Assessment: Report given to PACU RN, Post -op Vital signs reviewed and stable and Patient moving all extremities X 4  Post vital signs: Reviewed and stable  Complications: No apparent anesthesia complications

## 2014-02-22 NOTE — Discharge Instructions (Signed)
° °  Kevin M. Supple, M.D., F.A.A.O.S. °Orthopaedic Surgery °Specializing in Arthroscopic and Reconstructive °Surgery of the Shoulder and Knee °[phone] °[address] - [place], [place] [address] - Fax [phone] ° ° °POST-OP TOTAL SHOULDER REPLACEMENT/SHOULDER HEMIARTHROPLASTY INSTRUCTIONS ° °1. Call the office at [phone] to schedule your first post-op appointment 10-14 days from the date of your surgery. ° °2. The bandage over your incision is waterproof. You may begin showering with this dressing on. You may leave this dressing on until first follow up appointment within 2 weeks. If you would like to remove it you may do so after the 5th day. Go slow and tug at the borders gently to break the bond the dressing has with the skin. The steri strips may come off with the dressing. At this point if there is no drainage it is okay to go without a bandage or you may cover it with a light guaze and tape. Leave the steri-strips in place over your incision. You can expect drainage that is bloody or yellow in nature that should gradually decrease from day of surgery. Change your dressing daily until drainage is completely resolved, then you may feel free to go without a bandage. You can also expect significant bruising around your shoulder that will drift down your arm and into your chest wall. This is very normal and should resolve over several days. ° ° 3. Wear your sling/immobilizer at all times except to perform the exercises below or to occasionally let your arm dangle by your side to stretch your elbow. You also need to sleep in your sling immobilizer until instructed otherwise. ° °4. Range of motion to your elbow, wrist, and hand are encouraged 3-5 times daily. Exercise to your hand and fingers helps to reduce swelling you may experience. ° °5. Utilize ice to the shoulder 3-5 times minimum a day and additionally if you are experiencing pain. ° °6. Prescriptions for a pain medication and a muscle  relaxant are provided for you. It is recommended that if you are experiencing pain that you pain medication alone is not controlling, add the muscle relaxant along with the pain medication which can give additional pain relief. The first 1-2 days is generally the most severe of your pain and then should gradually decrease. As your pain lessens it is recommended that you decrease your use of the pain medications to an "as needed basis'" only and to always comply with the recommended dosages of the pain medications. ° °7. Pain medications can produce constipation along with their use. If you experience this, the use of an over the counter stool softener or laxative daily is recommended.  ° °8. For most patients, if insurance allows, home health services to include therapy has been arranged. ° °9. For additional questions or concerns, please do not hesitate to call the office. If after hours there is an answering service to forward your concerns to the physician on call. ° °POST-OP EXERCISES ° °Pendulum Exercises ° °Perform pendulum exercises while standing and bending at the waist. Support your uninvolved arm on a table or chair and allow your operated arm to hang freely. Make sure to do these exercises passively - not using you shoulder muscles. ° °Repeat 20 times. Do 3 sessions per day. ° ° ° ° °

## 2014-02-22 NOTE — Progress Notes (Signed)
Utilization review completed.  

## 2014-02-22 NOTE — H&P (Signed)
William Gill    Chief Complaint: right total shoulder arthroplasty HPI: The patient is a 58 y.o. male with end stage right shoulder OA  Past Medical History  Diagnosis Date  . Hyperlipidemia     takes Atorvastatin and Tricor daily  . GERD (gastroesophageal reflux disease)     takes Omeprazole daily  . Hypertension     takes Amlodipine,HCTZ,Atenolol,and Lisinopril daily  . History of bronchitis winter 2014  . Arthritis   . Joint pain   . Chronic back pain     DDD    Past Surgical History  Procedure Laterality Date  . Back surgery      microdiscectomy  . Cervical fusion    . Tonsillectomy    . Ankle surgery Left   . Colonoscopy    . Shoulder arthroscopy Right   . Cardiac catheterization  2011/2012    History reviewed. No pertinent family history.  Social History:  reports that he has quit smoking. He does not have any smokeless tobacco history on file. He reports that he drinks alcohol. He reports that he does not use illicit drugs.  Allergies: No Known Allergies  Medications Prior to Admission  Medication Sig Dispense Refill  . amLODipine (NORVASC) 10 MG tablet Take 10 mg by mouth every morning.      Marland Kitchen atenolol (TENORMIN) 50 MG tablet Take 50 mg by mouth 2 (two) times daily.      Marland Kitchen atorvastatin (LIPITOR) 40 MG tablet Take 40 mg by mouth daily.      . Coenzyme Q10 (CO Q 10) 100 MG CAPS Take 100 mg by mouth daily.      . fenofibrate (TRICOR) 145 MG tablet Take 145 mg by mouth daily.      . hydrochlorothiazide (HYDRODIURIL) 25 MG tablet Take 12.5 mg by mouth every morning.      Marland Kitchen HYDROcodone-acetaminophen (NORCO) 10-325 MG per tablet Take 1 tablet by mouth 3 (three) times daily as needed for moderate pain.      Marland Kitchen lisinopril (PRINIVIL,ZESTRIL) 10 MG tablet Take 10 mg by mouth every evening.      . Multiple Vitamin (MULTIVITAMIN) tablet Take 1 tablet by mouth daily.      . naproxen sodium (ANAPROX) 220 MG tablet Take 440 mg by mouth 2 (two) times daily.      Marland Kitchen omeprazole  (PRILOSEC) 40 MG capsule Take 40 mg by mouth daily.         Physical Exam: right shoulder with painful and restricted motion as noted at recent office visits  Vitals  Temp:  [98.1 F (36.7 C)] 98.1 F (36.7 C) (09/03 0602) Pulse Rate:  [59-86] 86 (09/03 0726) Resp:  [18] 18 (09/03 0602) BP: (119)/(64) 119/64 mmHg (09/03 0602) SpO2:  [99 %] 99 % (09/03 0602)  Assessment/Plan  Impression: right total shoulder arthroplasty  Plan of Action: Procedure(s): RIGHT TOTAL SHOULDER ARTHROPLASTY  Emory Gallentine M 02/22/2014, 7:27 AM

## 2014-02-22 NOTE — Op Note (Signed)
02/22/2014  9:45 AM  PATIENT:   William Gill  58 y.o. male  PRE-OPERATIVE DIAGNOSIS:  End stage right shoulder OA  POST-OPERATIVE DIAGNOSIS:  same  PROCEDURE:  Right total shoulder arthroplasty #14 press fit stem, 52x21 eccentric head, 48 glenoid  SURGEON:  Masen Salvas, Metta Clines M.D.  ASSISTANTS: Shuford pac   ANESTHESIA:   GET + ISB  EBL: 250  SPECIMEN:  none  Drains: none   PATIENT DISPOSITION:  PACU - hemodynamically stable.    PLAN OF CARE: Admit to inpatient   Dictation# [phone] [id number]

## 2014-02-23 ENCOUNTER — Encounter (HOSPITAL_COMMUNITY): Payer: Self-pay | Admitting: Orthopedic Surgery

## 2014-02-23 MED ORDER — DIAZEPAM 5 MG PO TABS
2.5000 mg | ORAL_TABLET | Freq: Four times a day (QID) | ORAL | Status: DC | PRN
Start: 2014-02-23 — End: 2014-12-28

## 2014-02-23 MED ORDER — OXYCODONE-ACETAMINOPHEN 5-325 MG PO TABS: 1.0000 | ORAL_TABLET | ORAL | Status: DC | PRN

## 2014-02-23 NOTE — Evaluation (Signed)
Occupational Therapy Evaluation and Discharge Patient Details Name: William Gill MRN: [id number] DOB: 18-Dec-1955 Today's Date: 02/23/2014    History of Present Illness Rt TSA   Clinical Impression   This 58 yo male admitted with above presents to acute OT with all education completed except for follow up with pt for protocol exercises. Acute OT will sign off.    Follow Up Recommendations  No OT follow up    Equipment Recommendations  None recommended by OT       Precautions / Restrictions Precautions Precautions: Shoulder Shoulder Interventions: Shoulder sling/immobilizer;Off for dressing/bathing/exercises Required Braces or Orthoses: Sling Restrictions RUE Weight Bearing: Non weight bearing      Mobility Bed Mobility Overal bed mobility: Independent                Transfers Overall transfer level: Independent                                         Pertinent Vitals/Pain Pain Assessment: 0-10 Pain Score: 3  Pain Location: right shoulder Pain Descriptors / Indicators: Aching Pain Intervention(s): Monitored during session     Hand Dominance Right   Extremity/Trunk Assessment Upper Extremity Assessment Upper Extremity Assessment: RUE deficits/detail RUE Deficits / Details: TSA this admisssion; elbow distally WNL RUE Coordination: decreased gross motor           Communication Communication Communication: No difficulties   Cognition Arousal/Alertness: Awake/alert Behavior During Therapy: WFL for tasks assessed/performed Overall Cognitive Status: Within Functional Limits for tasks assessed                        Exercises   Other Exercises Other Exercises: 10 reps of Dr. Susie Cassette TSA protocol supine shoulder flexion not greater than 90 degrees (pt 50 degrees today), shoulder external rotation not more than 30 degrees past neutral (pt to neutral today), then in sitting shoulder abduction not more than 60 degrees (pt 40  degrees today). This was done as AAROM wtih pt using other hand to A due to pt did not have anyone here that was going to be at home with him and the person who came to pick him up was dropping him off at home where his wife was.  He also completed 10 reps each of pendulums and elbow/flexion extension.  Upon further consideration, I went back into his room and told him to only do the pendulums and elbow exercises until I got further clarification from Dr. Onnie Graham about the other exercises since these would normally be PROM not AAROM for TSA--he verbalized understanding.   Shoulder Instructions Shoulder Instructions Donning/doffing shirt without moving shoulder: Modified independent Method for sponge bathing under operated UE: Modified independent Donning/doffing sling/immobilizer: Modified independent Correct positioning of sling/immobilizer: Modified independent Pendulum exercises (written home exercise program): Independent ROM for elbow, wrist and digits of operated UE: Independent Sling wearing schedule (on at all times/off for ADL's): Independent Proper positioning of operated UE when showering: Independent Dressing change:  (NA for OT) Positioning of UE while sleeping: [place] expects to be discharged to:: Private residence Living Arrangements: Spouse/significant other Available Help at Discharge: Family;Available 24 hours/day                                    Prior  Functioning/Environment Level of Independence: Independent                      OT Goals(Current goals can be found in the care plan section) Acute Rehab OT Goals Patient Stated Goal: Home today  OT Frequency:                End of Session Equipment Utilized During Treatment:  (sling) Nurse Communication:  (Pt ready to go from OT standpoint other than waiting on Dr. Onnie Graham to return my call)  Activity Tolerance: Patient tolerated treatment well Patient  left: in chair;with call bell/phone within reach   Time: 0909-1000 OT Time Calculation (min): 51 min Charges:  OT General Charges $OT Visit: 1 Procedure OT Evaluation $Initial OT Evaluation Tier I: 1 Procedure OT Treatments $Self Care/Home Management : 23-37 mins $Therapeutic Exercise: 8-22 mins  Almon Register [phone] 02/23/2014, 5:07 PM

## 2014-02-23 NOTE — Progress Notes (Signed)
Pt was not to leave until around 15:00-15:30 and per Dr. Susie Cassette office he should be calling me back around 14:00 when he got back to the office. Pt found me in the hallway and said his ride was on the way to get him then (around 13:00). I told him that he was to continue to do the pendulums and elbow ROM until I called him and let him know about what Dr. Onnie Graham said--pt verbalized understanding. After receiving a call back from Dr. Onnie Graham he said he did NOT want the patient to do the AAROM to only stick with pendulums and elbow exercises. I immediately called the patient and relayed this information to him and he thanked me for calling.  [place], [place] [phone] 02/23/2014

## 2014-02-23 NOTE — Discharge Summary (Signed)
PATIENT ID:      William Gill  MRN:     [id number] DOB/AGE:    1956-05-25 / 58 y.o.     DISCHARGE SUMMARY  ADMISSION DATE:    02/22/2014 DISCHARGE DATE:    ADMISSION DIAGNOSIS: right total shoulder arthroplasty Past Medical History  Diagnosis Date  . Hyperlipidemia     takes Atorvastatin and Tricor daily  . GERD (gastroesophageal reflux disease)     takes Omeprazole daily  . Hypertension     takes Amlodipine,HCTZ,Atenolol,and Lisinopril daily  . History of bronchitis winter 2014  . Arthritis   . Joint pain   . Chronic back pain     DDD    DISCHARGE DIAGNOSIS:   Principal Problem:   Arthritis of shoulder region, right, degenerative Active Problems:   Essential hypertension, benign   S/P shoulder replacement   PROCEDURE: Procedure(s): RIGHT TOTAL SHOULDER ARTHROPLASTY on 02/22/2014  CONSULTS:   none  HISTORY:  See H&P in chart.  HOSPITAL COURSE:  William Gill is a 58 y.o. admitted on 02/22/2014 with a chief complaint of right shoulder pain and dysfunction, and found to have a diagnosis of right total shoulder arthroplasty.  They were brought to the operating room on 02/22/2014 and underwent Procedure(s): RIGHT TOTAL SHOULDER ARTHROPLASTY.    They were given perioperative antibiotics: Anti-infectives   Start     Dose/Rate Route Frequency Ordered Stop   02/22/14 1315  ceFAZolin (ANCEF) IVPB 1 g/50 mL premix     1 g 100 mL/hr over 30 Minutes Intravenous Every 6 hours 02/22/14 1306 02/23/14 0216   02/22/14 0600  ceFAZolin (ANCEF) IVPB 2 g/50 mL premix     2 g 100 mL/hr over 30 Minutes Intravenous On call to O.R. 02/21/14 1350 02/22/14 0750    .  Patient underwent the above named procedure and tolerated it well. The following day they were hemodynamically stable and pain was controlled on oral analgesics. They were neurovascularly intact to the operative extremity. OT was ordered and worked with patient per protocol. They were medically and orthopaedically stable for  discharge on day 1    DIAGNOSTIC STUDIES:  RECENT RADIOGRAPHIC STUDIES :  No results found.  RECENT VITAL SIGNS:  Patient Vitals for the past 24 hrs:  BP Temp Temp src Pulse Resp SpO2 Height Weight  02/23/14 0525 109/52 mmHg 98 F (36.7 C) Oral 73 - 98 % - -  02/23/14 0132 123/54 mmHg 98.1 F (36.7 C) Oral 65 - 98 % - -  02/22/14 2206 - - - - - - 6' 1.5" (1.867 m) 89.812 kg (198 lb)  02/22/14 2101 114/58 mmHg 98.4 F (36.9 C) Oral 85 18 98 % - -  02/22/14 1802 110/59 mmHg 98.2 F (36.8 C) Oral 74 18 97 % - -  02/22/14 1300 113/63 mmHg 97.9 F (36.6 C) Oral 83 18 94 % - -  02/22/14 1245 105/78 mmHg 97.8 F (36.6 C) - 81 - 94 % - -  02/22/14 1200 101/70 mmHg - - 78 - 94 % - -  02/22/14 1100 115/59 mmHg 97.5 F (36.4 C) - 82 15 98 % - -  02/22/14 1045 111/53 mmHg - - 72 14 100 % - -  02/22/14 1030 111/51 mmHg - - 69 10 100 % - -  02/22/14 1015 96/45 mmHg - - 75 13 98 % - -  02/22/14 1000 108/58 mmHg 97.9 F (36.6 C) - 77 17 100 % - -  .  RECENT EKG  RESULTS:    Orders placed during the hospital encounter of 02/12/14  . EKG 12-LEAD  . EKG 12-LEAD    DISCHARGE INSTRUCTIONS:    DISCHARGE MEDICATIONS:     Medication List    STOP taking these medications       HYDROcodone-acetaminophen 10-325 MG per tablet  Commonly known as:  NORCO      TAKE these medications       amLODipine 10 MG tablet  Commonly known as:  NORVASC  Take 10 mg by mouth every morning.     atenolol 50 MG tablet  Commonly known as:  TENORMIN  Take 50 mg by mouth 2 (two) times daily.     atorvastatin 40 MG tablet  Commonly known as:  LIPITOR  Take 40 mg by mouth daily.     Co Q 10 100 MG Caps  Take 100 mg by mouth daily.     diazepam 5 MG tablet  Commonly known as:  VALIUM  Take 0.5-1 tablets (2.5-5 mg total) by mouth every 6 (six) hours as needed for muscle spasms or sedation.     fenofibrate 145 MG tablet  Commonly known as:  TRICOR  Take 145 mg by mouth daily.      hydrochlorothiazide 25 MG tablet  Commonly known as:  HYDRODIURIL  Take 12.5 mg by mouth every morning.     lisinopril 10 MG tablet  Commonly known as:  PRINIVIL,ZESTRIL  Take 10 mg by mouth every evening.     multivitamin tablet  Take 1 tablet by mouth daily.     naproxen sodium 220 MG tablet  Commonly known as:  ANAPROX  Take 440 mg by mouth 2 (two) times daily.     omeprazole 40 MG capsule  Commonly known as:  PRILOSEC  Take 40 mg by mouth daily.     oxyCODONE-acetaminophen 5-325 MG per tablet  Commonly known as:  PERCOCET  Take 1-2 tablets by mouth every 4 (four) hours as needed.        FOLLOW UP VISIT:       Follow-up Information   Follow up with SUPPLE,KEVIN M, MD. (call to be seen in 10 -14 days)    Specialty:  Orthopedic Surgery   Contact information:   [address] [place] [address] [phone]       DISCHARGE TO: Home  DISPOSITION: Good  DISCHARGE CONDITION:  William Gill for Dr. Justice Britain 02/23/2014, 8:05 AM

## 2014-04-06 ENCOUNTER — Other Ambulatory Visit: Payer: Self-pay

## 2014-12-05 NOTE — H&P (Signed)
  NTS SOAP Note  Vital Signs:  Vitals as of: 0/98/1191: Systolic 478: Diastolic 72: Heart Rate 76: Temp 98.57F: Height 42ft 2in: Weight 220Lbs 0 Ounces: BMI 28.25  BMI : 28.25 kg/m2  Subjective: This 59 year old male presents for of need for screening TCS.  Last had a colonoscopy ten years ago.  Denies any lower gi complaints.  No family h/o colon carcinoma.  Review of Symptoms:  Constitutional:unremarkable   Head:unremarkable Eyes:unremarkable   Nose/Mouth/Throat:unremarkable Cardiovascular:  unremarkable Respiratory:unremarkable Gastrointestinheartburn Genitourinary:unremarkable   joint, neck, and back pain Skin:unremarkable Hematolgic/Lymphatic:unremarkable   Allergic/Immunologic:unremarkable   Past Medical History:  Reviewed  Past Medical History  Surgical History: back, neck, shoulder replacement Medical Problems: high cholesterol, HTN, CAD, reflux Allergies: nkda Medications: prilosec, tricor, lipitor, HCTZ, norvasc, lisinopril, atenolol, flexeril   Social History:Reviewed  Social History  Preferred Language: English Race:  White Ethnicity: Not Hispanic / Latino Age: 59 year Marital Status:  M Alcohol: 1 beer/day   Smoking Status: Never smoker reviewed on 11/08/2014 Functional Status reviewed on 11/08/2014 ------------------------------------------------ Bathing: Normal Cooking: Normal Dressing: Normal Driving: Normal Eating: Normal Managing Meds: Normal Oral Care: Normal Shopping: Normal Toileting: Normal Transferring: Normal Walking: Normal Cognitive Status reviewed on 11/08/2014 ------------------------------------------------ Attention: Normal Decision Making: Normal Language: Normal Memory: Normal Motor: Normal Perception: Normal Problem Solving: Normal Visual and Spatial: Normal   Family History:Reviewed  Family Health History Mother, Deceased; Lung cancer;  Father, Deceased; Lung cancer;     Objective  Information: General:Well appearing, well nourished in no distress. Heart:RRR, no murmur Lungs:  CTA bilaterally, no wheezes, rhonchi, rales.  Breathing unlabored. Abdomen:Soft, NT/ND, no HSM, no masses. deferred to procedure  Assessment:Need for screening TCS  Diagnoses: V76.51  Z12.11 Screening for malignant neoplasm of colon (Encounter for screening for malignant neoplasm of colon)  Procedures: 29562 - OFFICE OUTPATIENT NEW 20 MINUTES    Plan:  Scheduled for TCS on 01/04/15.   Patient Education:Alternative treatments to surgery were discussed with patient (and family).  Risks and benefits  of procedure incluidng bleeding and perforation were fully explained to the patient (and family) who gave informed consent. Patient/family questions were addressed.  Follow-up:Pending Surgery

## 2015-01-04 ENCOUNTER — Encounter (HOSPITAL_COMMUNITY)
Admission: RE | Disposition: A | Discharge status: Home / Self Care | Payer: Self-pay | Source: Ambulatory Visit | Attending: General Surgery

## 2015-01-04 ENCOUNTER — Encounter (HOSPITAL_COMMUNITY): Payer: Self-pay | Admitting: *Deleted

## 2015-01-04 ENCOUNTER — Ambulatory Visit (HOSPITAL_COMMUNITY)
Admission: RE | Admit: 2015-01-04 | Discharge: 2015-01-04 | Disposition: A | Discharge status: Home / Self Care | Payer: BLUE CROSS/BLUE SHIELD | Source: Ambulatory Visit | Attending: General Surgery | Admitting: General Surgery

## 2015-01-04 ENCOUNTER — Other Ambulatory Visit (HOSPITAL_COMMUNITY): Payer: Self-pay | Admitting: Neurosurgery

## 2015-01-04 DIAGNOSIS — K5730 Diverticulosis of large intestine without perforation or abscess without bleeding: Secondary | ICD-10-CM | POA: Diagnosis not present

## 2015-01-04 DIAGNOSIS — Z1211 Encounter for screening for malignant neoplasm of colon: Secondary | ICD-10-CM | POA: Diagnosis present

## 2015-01-04 DIAGNOSIS — I2510 Atherosclerotic heart disease of native coronary artery without angina pectoris: Secondary | ICD-10-CM | POA: Diagnosis not present

## 2015-01-04 DIAGNOSIS — Z79899 Other long term (current) drug therapy: Secondary | ICD-10-CM | POA: Insufficient documentation

## 2015-01-04 DIAGNOSIS — K219 Gastro-esophageal reflux disease without esophagitis: Secondary | ICD-10-CM | POA: Insufficient documentation

## 2015-01-04 DIAGNOSIS — E780 Pure hypercholesterolemia: Secondary | ICD-10-CM | POA: Insufficient documentation

## 2015-01-04 DIAGNOSIS — M4726 Other spondylosis with radiculopathy, lumbar region: Secondary | ICD-10-CM

## 2015-01-04 DIAGNOSIS — I10 Essential (primary) hypertension: Secondary | ICD-10-CM | POA: Diagnosis not present

## 2015-01-04 HISTORY — PX: COLONOSCOPY: SHX5424

## 2015-01-04 HISTORY — DX: Anesthesia of skin: R20.0

## 2015-01-04 SURGERY — COLONOSCOPY
Anesthesia: Moderate Sedation

## 2015-01-04 MED ORDER — MEPERIDINE HCL 100 MG/ML IJ SOLN
INTRAMUSCULAR | Status: AC
  Filled 2015-01-04: qty 2

## 2015-01-04 MED ORDER — MEPERIDINE HCL 50 MG/ML IJ SOLN
INTRAMUSCULAR | Status: DC | PRN
  Administered 2015-01-04: 50 mg via INTRAVENOUS

## 2015-01-04 MED ORDER — FLUMAZENIL 0.5 MG/5ML IV SOLN
INTRAVENOUS | Status: AC
  Filled 2015-01-04: qty 5

## 2015-01-04 MED ORDER — MIDAZOLAM HCL 5 MG/5ML IJ SOLN
INTRAMUSCULAR | Status: DC | PRN
  Administered 2015-01-04: 3 mg via INTRAVENOUS

## 2015-01-04 MED ORDER — SODIUM CHLORIDE 0.9 % IV SOLN
INTRAVENOUS | Status: DC
  Administered 2015-01-04: 07:00:00 via INTRAVENOUS

## 2015-01-04 MED ORDER — MIDAZOLAM HCL 5 MG/5ML IJ SOLN
INTRAMUSCULAR | Status: AC
  Filled 2015-01-04: qty 10

## 2015-01-04 MED ORDER — ATROPINE SULFATE 1 MG/ML IJ SOLN
INTRAMUSCULAR | Status: AC
  Filled 2015-01-04: qty 1

## 2015-01-04 MED ORDER — NALOXONE HCL 0.4 MG/ML IJ SOLN
INTRAMUSCULAR | Status: AC
  Filled 2015-01-04: qty 1

## 2015-01-04 MED ORDER — EPINEPHRINE HCL 0.1 MG/ML IJ SOSY
PREFILLED_SYRINGE | INTRAMUSCULAR | Status: AC
  Filled 2015-01-04: qty 10

## 2015-01-04 NOTE — Op Note (Signed)
[facility] [address] [place], [address]   COLONOSCOPY PROCEDURE REPORT     EXAM DATE: 01/12/2015  PATIENT NAME:      William Gill, William Gill           MR #:      [id number]  BIRTHDATE:       [birth year]      VISIT #:     [id number]  ATTENDING:     Aviva Signs, MD     STATUS:     outpatient ASSISTANT:  INDICATIONS:  The patient is a 59 yr old male here for a colonoscopy due to average risk patient for colon cancer. PROCEDURE PERFORMED:     Colonoscopy, screening MEDICATIONS:     Demerol 50 mg IV and Versed 3 mg IV ESTIMATED BLOOD LOSS:     None  CONSENT: The patient understands the risks and benefits of the procedure and understands that these risks include, but are not limited to: sedation, allergic reaction, infection, perforation and/or bleeding. Alternative means of evaluation and treatment include, among others: physical exam, x-rays, and/or surgical intervention. The patient elects to proceed with this endoscopic procedure.  DESCRIPTION OF PROCEDURE: During intra-op preparation period all mechanical & medical equipment was checked for proper function. Hand hygiene and appropriate measures for infection prevention was taken. After the risks, benefits and alternatives of the procedure were thoroughly explained, Informed consent was verified, confirmed and timeout was successfully executed by the treatment team. A digital exam revealed no abnormalities of the rectum. The EC-[id number] ([id number]) endoscope was introduced through the anus and advanced to the cecum, which was identified by both the appendix and ileocecal valve. adequate (Trilyte was used) The instrument was then slowly withdrawn as the colon was fully examined.Estimated blood loss is zero unless otherwise noted in this procedure report.   COLON FINDINGS: There was mild diverticulosis noted in the sigmoid colon. Retroflexed views revealed no abnormalities. The scope was then completely  withdrawn from the patient and the procedure terminated. SCOPE WITHDRAWAL TIME: 6    ADVERSE EVENTS:      There were no immediate complications.    Blood loss:  none IMPRESSIONS:     Mild diverticulosis was noted in the sigmoid colon   RECOMMENDATIONS:     Repeat Colonscopy in 10 years. RECALL:  _____________________________ Aviva Signs, MD eSigned:  Aviva Signs, MD January 12, 2015 7:52 AM   cc:   CPT CODES: ICD CODES:  The ICD and CPT codes recommended by this software are interpretations from the data that the clinical staff has captured with the software.  The verification of the translation of this report to the ICD and CPT codes and modifiers is the sole responsibility of the health care institution and practicing physician where this report was generated.  [employer] will not be held responsible for the validity of the ICD and CPT codes included on this report.  AMA assumes no liability for data contained or not contained herein. CPT is a Designer, television/film set of the [employer].

## 2015-01-04 NOTE — Discharge Instructions (Signed)
Diverticulosis °Diverticulosis is the condition that develops when small pouches (diverticula) form in the wall of your colon. Your colon, or large intestine, is where water is absorbed and stool is formed. The pouches form when the inside layer of your colon pushes through weak spots in the outer layers of your colon. °CAUSES  °No one knows exactly what causes diverticulosis. °RISK FACTORS °· Being older than 50. Your risk for this condition increases with age. Diverticulosis is rare in people younger than 40 years. By age 80, almost everyone has it. °· Eating a low-fiber diet. °· Being frequently constipated. °· Being overweight. °· Not getting enough exercise. °· Smoking. °· Taking over-the-counter pain medicines, like aspirin and ibuprofen. °SYMPTOMS  °Most people with diverticulosis do not have symptoms. °DIAGNOSIS  °Because diverticulosis often has no symptoms, health care providers often discover the condition during an exam for other colon problems. In many cases, a health care provider will diagnose diverticulosis while using a flexible scope to examine the colon (colonoscopy). °TREATMENT  °If you have never developed an infection related to diverticulosis, you may not need treatment. If you have had an infection before, treatment may include: °· Eating more fruits, vegetables, and grains. °· Taking a fiber supplement. °· Taking a live bacteria supplement (probiotic). °· Taking medicine to relax your colon. °HOME CARE INSTRUCTIONS  °· Drink at least 6-8 glasses of water each day to prevent constipation. °· Try not to strain when you have a bowel movement. °· Keep all follow-up appointments. °If you have had an infection before:  °· Increase the fiber in your diet as directed by your health care provider or dietitian. °· Take a dietary fiber supplement if your health care provider approves. °· Only take medicines as directed by your health care provider. °SEEK MEDICAL CARE IF:  °· You have abdominal  pain. °· You have bloating. °· You have cramps. °· You have not gone to the bathroom in 3 days. °SEEK IMMEDIATE MEDICAL CARE IF:  °· Your pain gets worse. °· Your bloating becomes very bad. °· You have a fever or chills, and your symptoms suddenly get worse. °· You begin vomiting. °· You have bowel movements that are bloody or black. °MAKE SURE YOU: °· Understand these instructions. °· Will watch your condition. °· Will get help right away if you are not doing well or get worse. °Document Released: 03/05/2004 Document Revised: 06/13/2013 Document Reviewed: 05/03/2013 °ExitCare® Patient Information ©2015 [employer]. This information is not intended to replace advice given to you by your health care provider. Make sure you discuss any questions you have with your health care provider. °Colonoscopy, Care After °Refer to this sheet in the next few weeks. These instructions provide you with information on caring for yourself after your procedure. Your health care provider may also give you more specific instructions. Your treatment has been planned according to current medical practices, but problems sometimes occur. Call your health care provider if you have any problems or questions after your procedure. °WHAT TO EXPECT AFTER THE PROCEDURE  °After your procedure, it is typical to have the following: °· A small amount of blood in your stool. °· Moderate amounts of gas and mild abdominal cramping or bloating. °HOME CARE INSTRUCTIONS °· Do not drive, operate machinery, or sign important documents for 24 hours. °· You may shower and resume your regular physical activities, but move at a slower pace for the first 24 hours. °· Take frequent rest periods for the first 24 hours. °· Walk   around or put a warm pack on your abdomen to help reduce abdominal cramping and bloating. °· Drink enough fluids to keep your urine clear or pale yellow. °· You may resume your normal diet as instructed by your health care provider. Avoid  heavy or fried foods that are hard to digest. °· Avoid drinking alcohol for 24 hours or as instructed by your health care provider. °· Only take over-the-counter or prescription medicines as directed by your health care provider. °· If a tissue sample (biopsy) was taken during your procedure: °¨ Do not take aspirin or blood thinners for 7 days, or as instructed by your health care provider. °¨ Do not drink alcohol for 7 days, or as instructed by your health care provider. °¨ Eat soft foods for the first 24 hours. °SEEK MEDICAL CARE IF: °You have persistent spotting of blood in your stool 2-3 days after the procedure. °SEEK IMMEDIATE MEDICAL CARE IF: °· You have more than a small spotting of blood in your stool. °· You pass large blood clots in your stool. °· Your abdomen is swollen (distended). °· You have nausea or vomiting. °· You have a fever. °· You have increasing abdominal pain that is not relieved with medicine. °Document Released: 01/21/2004 Document Revised: 03/29/2013 Document Reviewed: 02/13/2013 °ExitCare® Patient Information ©2015 [employer]. This information is not intended to replace advice given to you by your health care provider. Make sure you discuss any questions you have with your health care provider. ° °

## 2015-01-04 NOTE — Interval H&P Note (Signed)
History and Physical Interval Note:  01/04/2015 7:28 AM  William Gill  has presented today for surgery, with the diagnosis of screening  The various methods of treatment have been discussed with the patient and family. After consideration of risks, benefits and other options for treatment, the patient has consented to  Procedure(s) with comments: COLONOSCOPY (N/A) - 730 as a surgical intervention .  The patient's history has been reviewed, patient examined, no change in status, stable for surgery.  I have reviewed the patient's chart and labs.  Questions were answered to the patient's satisfaction.     Aviva Signs A

## 2015-01-07 ENCOUNTER — Encounter (HOSPITAL_COMMUNITY): Payer: Self-pay | Admitting: General Surgery

## 2015-01-08 ENCOUNTER — Other Ambulatory Visit (HOSPITAL_COMMUNITY): Payer: Self-pay | Admitting: Internal Medicine

## 2015-01-08 DIAGNOSIS — R05 Cough: Secondary | ICD-10-CM

## 2015-01-08 DIAGNOSIS — Z87891 Personal history of nicotine dependence: Secondary | ICD-10-CM

## 2015-01-08 DIAGNOSIS — R059 Cough, unspecified: Secondary | ICD-10-CM

## 2015-01-10 ENCOUNTER — Ambulatory Visit (HOSPITAL_COMMUNITY): Payer: BLUE CROSS/BLUE SHIELD

## 2015-01-16 ENCOUNTER — Other Ambulatory Visit (HOSPITAL_COMMUNITY): Payer: BLUE CROSS/BLUE SHIELD

## 2015-01-16 ENCOUNTER — Ambulatory Visit (HOSPITAL_COMMUNITY)
Admission: RE | Admit: 2015-01-16 | Discharge: 2015-01-16 | Disposition: A | Discharge status: Home / Self Care | Payer: BLUE CROSS/BLUE SHIELD | Source: Ambulatory Visit | Attending: Neurosurgery | Admitting: Neurosurgery

## 2015-01-16 ENCOUNTER — Other Ambulatory Visit (HOSPITAL_COMMUNITY): Payer: Self-pay | Admitting: Neurosurgery

## 2015-01-16 DIAGNOSIS — M4726 Other spondylosis with radiculopathy, lumbar region: Secondary | ICD-10-CM

## 2015-01-16 DIAGNOSIS — M545 Low back pain: Secondary | ICD-10-CM | POA: Diagnosis present

## 2015-01-16 DIAGNOSIS — M79661 Pain in right lower leg: Secondary | ICD-10-CM | POA: Insufficient documentation

## 2015-01-16 DIAGNOSIS — M25552 Pain in left hip: Secondary | ICD-10-CM | POA: Insufficient documentation

## 2015-01-16 DIAGNOSIS — R200 Anesthesia of skin: Secondary | ICD-10-CM | POA: Diagnosis not present

## 2015-01-16 DIAGNOSIS — M4807 Spinal stenosis, lumbosacral region: Secondary | ICD-10-CM | POA: Insufficient documentation

## 2015-01-16 DIAGNOSIS — M25551 Pain in right hip: Secondary | ICD-10-CM | POA: Insufficient documentation

## 2015-01-16 DIAGNOSIS — M4806 Spinal stenosis, lumbar region: Secondary | ICD-10-CM | POA: Insufficient documentation

## 2015-01-16 LAB — POCT I-STAT CREATININE: CREATININE: 1.8 mg/dL — AB (ref 0.61–1.24)

## 2015-01-22 ENCOUNTER — Other Ambulatory Visit: Payer: Self-pay | Admitting: Neurosurgery

## 2015-02-15 ENCOUNTER — Encounter (HOSPITAL_COMMUNITY)
Admission: RE | Admit: 2015-02-15 | Discharge: 2015-02-15 | Disposition: A | Discharge status: Home / Self Care | Payer: BLUE CROSS/BLUE SHIELD | Source: Ambulatory Visit | Attending: Neurosurgery | Admitting: Neurosurgery

## 2015-02-15 ENCOUNTER — Encounter (HOSPITAL_COMMUNITY): Payer: Self-pay

## 2015-02-15 ENCOUNTER — Ambulatory Visit (HOSPITAL_COMMUNITY)
Admission: RE | Admit: 2015-02-15 | Discharge: 2015-02-15 | Disposition: A | Discharge status: Home / Self Care | Payer: BLUE CROSS/BLUE SHIELD | Source: Ambulatory Visit | Attending: Internal Medicine | Admitting: Internal Medicine

## 2015-02-15 DIAGNOSIS — Z01810 Encounter for preprocedural cardiovascular examination: Secondary | ICD-10-CM | POA: Insufficient documentation

## 2015-02-15 DIAGNOSIS — M4806 Spinal stenosis, lumbar region: Secondary | ICD-10-CM | POA: Insufficient documentation

## 2015-02-15 DIAGNOSIS — R05 Cough: Secondary | ICD-10-CM | POA: Insufficient documentation

## 2015-02-15 DIAGNOSIS — Z801 Family history of malignant neoplasm of trachea, bronchus and lung: Secondary | ICD-10-CM | POA: Diagnosis not present

## 2015-02-15 DIAGNOSIS — Z01812 Encounter for preprocedural laboratory examination: Secondary | ICD-10-CM | POA: Diagnosis not present

## 2015-02-15 DIAGNOSIS — Z87891 Personal history of nicotine dependence: Secondary | ICD-10-CM | POA: Insufficient documentation

## 2015-02-15 DIAGNOSIS — R059 Cough, unspecified: Secondary | ICD-10-CM

## 2015-02-15 HISTORY — DX: Adverse effect of unspecified anesthetic, initial encounter: T41.45XA

## 2015-02-15 LAB — BASIC METABOLIC PANEL
ANION GAP: 6 (ref 5–15)
BUN: 17 mg/dL (ref 6–20)
CHLORIDE: 105 mmol/L (ref 101–111)
CO2: 26 mmol/L (ref 22–32)
Calcium: 9.6 mg/dL (ref 8.9–10.3)
Creatinine, Ser: 1.2 mg/dL (ref 0.61–1.24)
Glucose, Bld: 120 mg/dL — ABNORMAL HIGH (ref 65–99)
POTASSIUM: 3.7 mmol/L (ref 3.5–5.1)
SODIUM: 137 mmol/L (ref 135–145)

## 2015-02-15 LAB — CBC
HEMATOCRIT: 37.8 % — AB (ref 39.0–52.0)
HEMOGLOBIN: 13 g/dL (ref 13.0–17.0)
MCH: 29.9 pg (ref 26.0–34.0)
MCHC: 34.4 g/dL (ref 30.0–36.0)
MCV: 86.9 fL (ref 78.0–100.0)
Platelets: 332 10*3/uL (ref 150–400)
RBC: 4.35 MIL/uL (ref 4.22–5.81)
RDW: 13.6 % (ref 11.5–15.5)
WBC: 8.9 10*3/uL (ref 4.0–10.5)

## 2015-02-15 LAB — SURGICAL PCR SCREEN
MRSA, PCR: NEGATIVE
Staphylococcus aureus: NEGATIVE

## 2015-02-15 LAB — TYPE AND SCREEN
ABO/RH(D): A POS
ANTIBODY SCREEN: NEGATIVE

## 2015-02-15 NOTE — Pre-Procedure Instructions (Addendum)
William Gill  02/15/2015      [id number] - [place], [place] - [address] [place] [address] Phone: [phone] Fax: [phone]    Your procedure is scheduled on 03/01/15  Report to [facility] short stay admitting at 700 A.M.  Call this number if you have problems the morning of surgery:  [phone]   Remember:  Do not eat food or drink liquids after midnight.  Take these medicines the morning of surgery with A SIP OF WATER amlodipine(norvasc), atenolol(tenormin)hydrocodone if needed, omeprazole(prilosec)   STOP all herbel meds, nsaids (aleve,naproxen,advil,ibuprofen) 5 days prior to surgery starting 02/24/15 including coq10, vitamins,    Do not wear jewelry, make-up or nail polish.  Do not wear lotions, powders, or perfumes.  You may wear deodorant.  Do not shave 48 hours prior to surgery.  Men may shave face and neck.  Do not bring valuables to the hospital.  [facility] is not responsible for any belongings or valuables.  Contacts, dentures or bridgework may not be worn into surgery.  Leave your suitcase in the car.  After surgery it may be brought to your room.  For patients admitted to the hospital, discharge time will be determined by your treatment team.  Patients discharged the day of surgery will not be allowed to drive home.   Name and phone number of your driver:    Special instructions:  Special Instructions: [place] - Preparing for Surgery  Before surgery, you can play an important role.  Because skin is not sterile, your skin needs to be as free of germs as possible.  You can reduce the number of germs on you skin by washing with CHG (chlorahexidine gluconate) soap before surgery.  CHG is an antiseptic cleaner which kills germs and bonds with the skin to continue killing germs even after washing.  Please DO NOT use if you have an allergy to CHG or antibacterial soaps.  If your skin becomes reddened/irritated  stop using the CHG and inform your nurse when you arrive at Short Stay.  Do not shave (including legs and underarms) for at least 48 hours prior to the first CHG shower.  You may shave your face.  Please follow these instructions carefully:   1.  Shower with CHG Soap the night before surgery and the morning of Surgery.  2.  If you choose to wash your hair, wash your hair first as usual with your normal shampoo.  3.  After you shampoo, rinse your hair and body thoroughly to remove the Shampoo.  4.  Use CHG as you would any other liquid soap.  You can apply chg directly  to the skin and wash gently with scrungie or a clean washcloth.  5.  Apply the CHG Soap to your body ONLY FROM THE NECK DOWN.  Do not use on open wounds or open sores.  Avoid contact with your eyes ears, mouth and genitals (private parts).  Wash genitals (private parts)       with your normal soap.  6.  Wash thoroughly, paying special attention to the area where your surgery will be performed.  7.  Thoroughly rinse your body with warm water from the neck down.  8.  DO NOT shower/wash with your normal soap after using and rinsing off the CHG Soap.  9.  Pat yourself dry with a clean towel.            10.  Wear clean pajamas.            11.  Place clean sheets on your bed the night of your first shower and do not sleep with pets.  Day of Surgery  Do not apply any lotions/deodorants the morning of surgery.  Please wear clean clothes to the hospital/surgery center.  Please read over the following fact sheets that you were given. Pain Booklet, Coughing and Deep Breathing, Blood Transfusion Information, MRSA Information and Surgical Site Infection Prevention

## 2015-02-18 NOTE — Progress Notes (Signed)
Anesthesia Chart Review:  Pt is 59 year old male scheduled for L3-4, L4-5, L5-S1 PLIF on 02/26/15 with Dr. Christella Noa.   PCP is Dr. Redmond School  PMH: HTN, hyperlipidemia, GERD, arthritis, chronic back pain. Former smoker. BMI 28. 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   Medications include: amlodipine, atenolol, hctz, lisinopril, lipitor, tricor, prilosec, naproxen.   Preoperative labs reviewed.   EKG 02/15/2015: NSR. Possible Anteroseptal infarct, age undetermined. No significant change since last tracing 02/12/2014 per Dr. Doug Sou interpretation. Appears similar to EKG 04/05/2001.   Cardiac cath 2011 (report dated 10/11/2009 is found in media tab):  -mild, nonobstructive coronary artery disease without hemodynamic significant lesion (only 20% mid and proximal OM stenosis) -normal LV function -no significant mitral regurgitation or aortic stenosis  EKG appears stable dating back to 2002. He had no significant CAD with only mild 20% mid and proximal OM stenosis) by cath 5 years ago. He is no longer followed by cardiology. No CV symptoms documented at his PAT visit.  Tolerated shoulder arthroplasty one year ago without issue. He will be further evaluated by his assigned anesthesiologist, but if no acute changes or new CV symptoms then I would anticipate that he could proceed as planned.  Willeen Cass, FNP-BC [facility] Short Stay [facility]/Anesthesiology Phone: [phone] 02/18/2015 1:52 PM

## 2015-03-01 ENCOUNTER — Encounter (HOSPITAL_COMMUNITY): Payer: Self-pay | Admitting: *Deleted

## 2015-03-01 ENCOUNTER — Inpatient Hospital Stay (HOSPITAL_COMMUNITY): Payer: BLUE CROSS/BLUE SHIELD | Admitting: Anesthesiology

## 2015-03-01 ENCOUNTER — Inpatient Hospital Stay (HOSPITAL_COMMUNITY)
Admission: RE | Admit: 2015-03-01 | Discharge: 2015-03-05 | DRG: 458 | Disposition: A | Discharge status: Home / Self Care | Payer: BLUE CROSS/BLUE SHIELD | Source: Ambulatory Visit | Attending: Neurosurgery | Admitting: Neurosurgery

## 2015-03-01 ENCOUNTER — Encounter (HOSPITAL_COMMUNITY)
Admission: RE | Disposition: A | Discharge status: Home / Self Care | Payer: BLUE CROSS/BLUE SHIELD | Source: Ambulatory Visit | Attending: Neurosurgery

## 2015-03-01 ENCOUNTER — Inpatient Hospital Stay (HOSPITAL_COMMUNITY): Payer: BLUE CROSS/BLUE SHIELD | Admitting: Emergency Medicine

## 2015-03-01 ENCOUNTER — Inpatient Hospital Stay (HOSPITAL_COMMUNITY): Payer: BLUE CROSS/BLUE SHIELD

## 2015-03-01 DIAGNOSIS — Z79899 Other long term (current) drug therapy: Secondary | ICD-10-CM | POA: Diagnosis not present

## 2015-03-01 DIAGNOSIS — M4316 Spondylolisthesis, lumbar region: Secondary | ICD-10-CM | POA: Diagnosis present

## 2015-03-01 DIAGNOSIS — I10 Essential (primary) hypertension: Secondary | ICD-10-CM | POA: Diagnosis present

## 2015-03-01 DIAGNOSIS — Z791 Long term (current) use of non-steroidal anti-inflammatories (NSAID): Secondary | ICD-10-CM

## 2015-03-01 DIAGNOSIS — M419 Scoliosis, unspecified: Secondary | ICD-10-CM | POA: Diagnosis present

## 2015-03-01 DIAGNOSIS — Z87891 Personal history of nicotine dependence: Secondary | ICD-10-CM | POA: Diagnosis not present

## 2015-03-01 DIAGNOSIS — M4727 Other spondylosis with radiculopathy, lumbosacral region: Secondary | ICD-10-CM | POA: Diagnosis present

## 2015-03-01 DIAGNOSIS — Z419 Encounter for procedure for purposes other than remedying health state, unspecified: Secondary | ICD-10-CM

## 2015-03-01 DIAGNOSIS — M79606 Pain in leg, unspecified: Secondary | ICD-10-CM | POA: Diagnosis present

## 2015-03-01 SURGERY — POSTERIOR LUMBAR FUSION 3 LEVEL
Anesthesia: General | Site: Back

## 2015-03-01 MED ORDER — CEFAZOLIN SODIUM-DEXTROSE 2-3 GM-% IV SOLR
INTRAVENOUS | Status: AC
  Filled 2015-03-01: qty 50

## 2015-03-01 MED ORDER — VECURONIUM BROMIDE 10 MG IV SOLR
INTRAVENOUS | Status: AC
  Filled 2015-03-01: qty 20

## 2015-03-01 MED ORDER — SODIUM CHLORIDE 0.9 % IV SOLN
10.0000 mg | INTRAVENOUS | Status: DC | PRN
  Administered 2015-03-01: 15:00:00 via INTRAVENOUS
  Administered 2015-03-01: 35 ug/min via INTRAVENOUS
  Administered 2015-03-01: 13:00:00 via INTRAVENOUS

## 2015-03-01 MED ORDER — PROPOFOL 10 MG/ML IV BOLUS
INTRAVENOUS | Status: DC | PRN
  Administered 2015-03-01: 150 mg via INTRAVENOUS

## 2015-03-01 MED ORDER — ACETAMINOPHEN 650 MG RE SUPP: 650.0000 mg | RECTAL | Status: DC | PRN

## 2015-03-01 MED ORDER — SUCCINYLCHOLINE CHLORIDE 20 MG/ML IJ SOLN
INTRAMUSCULAR | Status: AC
  Filled 2015-03-01: qty 1

## 2015-03-01 MED ORDER — ACETAMINOPHEN 10 MG/ML IV SOLN
INTRAVENOUS | Status: AC
  Administered 2015-03-01: 1000 mg via INTRAVENOUS
  Filled 2015-03-01: qty 100

## 2015-03-01 MED ORDER — OXYCODONE-ACETAMINOPHEN 5-325 MG PO TABS
1.0000 | ORAL_TABLET | ORAL | Status: DC | PRN
  Administered 2015-03-01 – 2015-03-05 (×12): 2 via ORAL
  Filled 2015-03-01: qty 1
  Filled 2015-03-01 (×5): qty 2
  Filled 2015-03-01: qty 1
  Filled 2015-03-01 (×6): qty 2

## 2015-03-01 MED ORDER — FENTANYL CITRATE (PF) 100 MCG/2ML IJ SOLN
INTRAMUSCULAR | Status: DC | PRN
  Administered 2015-03-01 (×5): 50 ug via INTRAVENOUS

## 2015-03-01 MED ORDER — BISACODYL 5 MG PO TBEC
5.0000 mg | DELAYED_RELEASE_TABLET | Freq: Every day | ORAL | Status: DC | PRN
  Administered 2015-03-02: 5 mg via ORAL
  Filled 2015-03-01: qty 1

## 2015-03-01 MED ORDER — HYDROMORPHONE HCL 1 MG/ML IJ SOLN
0.2500 mg | INTRAMUSCULAR | Status: DC | PRN
  Administered 2015-03-01 (×4): 0.5 mg via INTRAVENOUS

## 2015-03-01 MED ORDER — MENTHOL 3 MG MT LOZG: 1.0000 | LOZENGE | OROMUCOSAL | Status: DC | PRN

## 2015-03-01 MED ORDER — OXYCODONE-ACETAMINOPHEN 5-325 MG PO TABS
ORAL_TABLET | ORAL | Status: AC
  Filled 2015-03-01: qty 2

## 2015-03-01 MED ORDER — LIDOCAINE HCL (CARDIAC) 20 MG/ML IV SOLN
INTRAVENOUS | Status: DC | PRN
  Administered 2015-03-01: 60 mg via INTRAVENOUS

## 2015-03-01 MED ORDER — DIAZEPAM 5 MG PO TABS
ORAL_TABLET | ORAL | Status: AC
  Filled 2015-03-01: qty 1

## 2015-03-01 MED ORDER — HYDROMORPHONE HCL 1 MG/ML IJ SOLN
INTRAMUSCULAR | Status: AC
  Filled 2015-03-01: qty 1

## 2015-03-01 MED ORDER — PHENYLEPHRINE 40 MCG/ML (10ML) SYRINGE FOR IV PUSH (FOR BLOOD PRESSURE SUPPORT)
PREFILLED_SYRINGE | INTRAVENOUS | Status: AC
  Filled 2015-03-01: qty 10

## 2015-03-01 MED ORDER — STERILE WATER FOR INJECTION IJ SOLN
INTRAMUSCULAR | Status: AC
  Filled 2015-03-01: qty 10

## 2015-03-01 MED ORDER — CEFAZOLIN SODIUM 1-5 GM-% IV SOLN
1.0000 g | Freq: Three times a day (TID) | INTRAVENOUS | Status: AC
  Administered 2015-03-02 (×2): 1 g via INTRAVENOUS
  Filled 2015-03-01 (×3): qty 50

## 2015-03-01 MED ORDER — LACTATED RINGERS IV SOLN
INTRAVENOUS | Status: DC | PRN
  Administered 2015-03-01 (×2): via INTRAVENOUS

## 2015-03-01 MED ORDER — BUPIVACAINE HCL (PF) 0.25 % IJ SOLN
INTRAMUSCULAR | Status: DC | PRN
  Administered 2015-03-01: 30 mL

## 2015-03-01 MED ORDER — SODIUM CHLORIDE 0.9 % IV SOLN
INTRAVENOUS | Status: DC | PRN
  Administered 2015-03-01: 16:00:00 via INTRAVENOUS

## 2015-03-01 MED ORDER — 0.9 % SODIUM CHLORIDE (POUR BTL) OPTIME
TOPICAL | Status: DC | PRN
  Administered 2015-03-01 (×3): 1000 mL

## 2015-03-01 MED ORDER — PHENYLEPHRINE HCL 10 MG/ML IJ SOLN
INTRAMUSCULAR | Status: DC | PRN
  Administered 2015-03-01 (×3): 80 ug via INTRAVENOUS
  Administered 2015-03-01: 40 ug via INTRAVENOUS

## 2015-03-01 MED ORDER — ROCURONIUM BROMIDE 100 MG/10ML IV SOLN
INTRAVENOUS | Status: DC | PRN
  Administered 2015-03-01: 50 mg via INTRAVENOUS

## 2015-03-01 MED ORDER — ONDANSETRON HCL 4 MG/2ML IJ SOLN
INTRAMUSCULAR | Status: DC | PRN
Start: 2015-03-01 — End: 2015-03-01
  Administered 2015-03-01: 4 mg via INTRAVENOUS

## 2015-03-01 MED ORDER — MIDAZOLAM HCL 2 MG/2ML IJ SOLN
INTRAMUSCULAR | Status: AC
  Filled 2015-03-01: qty 4

## 2015-03-01 MED ORDER — ALBUMIN HUMAN 5 % IV SOLN
INTRAVENOUS | Status: DC | PRN
  Administered 2015-03-01 (×2): via INTRAVENOUS

## 2015-03-01 MED ORDER — FENTANYL CITRATE (PF) 250 MCG/5ML IJ SOLN
INTRAMUSCULAR | Status: AC
  Filled 2015-03-01: qty 5

## 2015-03-01 MED ORDER — LACTATED RINGERS IV SOLN
INTRAVENOUS | Status: DC
  Administered 2015-03-01: 07:00:00 via INTRAVENOUS

## 2015-03-01 MED ORDER — SURGIFOAM 100 EX MISC
CUTANEOUS | Status: DC | PRN
  Administered 2015-03-01 (×2): via TOPICAL

## 2015-03-01 MED ORDER — PANTOPRAZOLE SODIUM 40 MG PO TBEC
80.0000 mg | DELAYED_RELEASE_TABLET | Freq: Every day | ORAL | Status: DC
  Administered 2015-03-02 – 2015-03-05 (×4): 80 mg via ORAL
  Filled 2015-03-01 (×4): qty 2

## 2015-03-01 MED ORDER — HYDROMORPHONE HCL 1 MG/ML IJ SOLN
0.5000 mg | INTRAMUSCULAR | Status: DC | PRN
  Administered 2015-03-01 – 2015-03-03 (×13): 1 mg via INTRAVENOUS
  Filled 2015-03-01 (×12): qty 1

## 2015-03-01 MED ORDER — SODIUM CHLORIDE 0.9 % IJ SOLN
INTRAMUSCULAR | Status: AC
  Filled 2015-03-01: qty 10

## 2015-03-01 MED ORDER — VECURONIUM BROMIDE 10 MG IV SOLR
INTRAVENOUS | Status: DC | PRN
  Administered 2015-03-01: 4 mg via INTRAVENOUS
  Administered 2015-03-01: 2 mg via INTRAVENOUS
  Administered 2015-03-01: 3 mg via INTRAVENOUS
  Administered 2015-03-01: 4 mg via INTRAVENOUS
  Administered 2015-03-01: 3 mg via INTRAVENOUS
  Administered 2015-03-01: 2 mg via INTRAVENOUS

## 2015-03-01 MED ORDER — GLYCOPYRROLATE 0.2 MG/ML IJ SOLN
INTRAMUSCULAR | Status: DC | PRN
  Administered 2015-03-01: .8 mg via INTRAVENOUS
  Administered 2015-03-01: 0.2 mg via INTRAVENOUS

## 2015-03-01 MED ORDER — MIDAZOLAM HCL 5 MG/5ML IJ SOLN
INTRAMUSCULAR | Status: DC | PRN
  Administered 2015-03-01: 2 mg via INTRAVENOUS

## 2015-03-01 MED ORDER — ACETAMINOPHEN 325 MG PO TABS
650.0000 mg | ORAL_TABLET | ORAL | Status: DC | PRN
  Administered 2015-03-02: 650 mg via ORAL
  Filled 2015-03-01: qty 2

## 2015-03-01 MED ORDER — PHENYLEPHRINE HCL 10 MG/ML IJ SOLN
INTRAMUSCULAR | Status: AC
  Filled 2015-03-01: qty 1

## 2015-03-01 MED ORDER — ARTIFICIAL TEARS OP OINT
TOPICAL_OINTMENT | OPHTHALMIC | Status: DC | PRN
  Administered 2015-03-01: 1 via OPHTHALMIC

## 2015-03-01 MED ORDER — ARTIFICIAL TEARS OP OINT
TOPICAL_OINTMENT | OPHTHALMIC | Status: AC
  Filled 2015-03-01: qty 3.5

## 2015-03-01 MED ORDER — ONE-DAILY MULTI VITAMINS PO TABS: 1.0000 | ORAL_TABLET | Freq: Every day | ORAL | Status: DC

## 2015-03-01 MED ORDER — EPHEDRINE SULFATE 50 MG/ML IJ SOLN
INTRAMUSCULAR | Status: AC
  Filled 2015-03-01: qty 1

## 2015-03-01 MED ORDER — LACTATED RINGERS IV SOLN
INTRAVENOUS | Status: DC | PRN
  Administered 2015-03-01 (×4): via INTRAVENOUS

## 2015-03-01 MED ORDER — PROMETHAZINE HCL 25 MG/ML IJ SOLN: 6.2500 mg | INTRAMUSCULAR | Status: DC | PRN

## 2015-03-01 MED ORDER — ROCURONIUM BROMIDE 50 MG/5ML IV SOLN
INTRAVENOUS | Status: AC
  Filled 2015-03-01: qty 1

## 2015-03-01 MED ORDER — ZOLPIDEM TARTRATE 5 MG PO TABS
5.0000 mg | ORAL_TABLET | Freq: Every evening | ORAL | Status: DC | PRN
  Administered 2015-03-01: 5 mg via ORAL
  Filled 2015-03-01: qty 1

## 2015-03-01 MED ORDER — HYDROMORPHONE HCL 1 MG/ML IJ SOLN
0.5000 mg | INTRAMUSCULAR | Status: AC | PRN
  Administered 2015-03-01 (×2): 0.5 mg via INTRAVENOUS

## 2015-03-01 MED ORDER — ATENOLOL 25 MG PO TABS
50.0000 mg | ORAL_TABLET | Freq: Two times a day (BID) | ORAL | Status: DC
  Administered 2015-03-01 – 2015-03-05 (×7): 50 mg via ORAL
  Filled 2015-03-01 (×8): qty 2

## 2015-03-01 MED ORDER — HYDROCHLOROTHIAZIDE 25 MG PO TABS
12.5000 mg | ORAL_TABLET | Freq: Every day | ORAL | Status: DC
  Administered 2015-03-02 – 2015-03-05 (×4): 12.5 mg via ORAL
  Filled 2015-03-01 (×4): qty 1

## 2015-03-01 MED ORDER — LIDOCAINE HCL (CARDIAC) 20 MG/ML IV SOLN
INTRAVENOUS | Status: AC
  Filled 2015-03-01: qty 5

## 2015-03-01 MED ORDER — HYDROCODONE-ACETAMINOPHEN 5-325 MG PO TABS
1.0000 | ORAL_TABLET | ORAL | Status: DC | PRN
  Administered 2015-03-01 – 2015-03-03 (×4): 2 via ORAL
  Filled 2015-03-01 (×4): qty 2

## 2015-03-01 MED ORDER — ONDANSETRON HCL 4 MG/2ML IJ SOLN
INTRAMUSCULAR | Status: AC
  Filled 2015-03-01: qty 2

## 2015-03-01 MED ORDER — DIAZEPAM 5 MG PO TABS
5.0000 mg | ORAL_TABLET | Freq: Four times a day (QID) | ORAL | Status: DC | PRN
  Administered 2015-03-01 – 2015-03-05 (×5): 5 mg via ORAL
  Filled 2015-03-01 (×4): qty 1

## 2015-03-01 MED ORDER — PROPOFOL 10 MG/ML IV BOLUS
INTRAVENOUS | Status: AC
  Filled 2015-03-01: qty 20

## 2015-03-01 MED ORDER — MAGNESIUM CITRATE PO SOLN
1.0000 | Freq: Once | ORAL | Status: AC | PRN
  Administered 2015-03-03: 1 via ORAL
  Filled 2015-03-01: qty 296

## 2015-03-01 MED ORDER — FENOFIBRATE 160 MG PO TABS
160.0000 mg | ORAL_TABLET | Freq: Every day | ORAL | Status: DC
  Administered 2015-03-02 – 2015-03-05 (×4): 160 mg via ORAL
  Filled 2015-03-01 (×4): qty 1

## 2015-03-01 MED ORDER — ONDANSETRON HCL 4 MG/2ML IJ SOLN: 4.0000 mg | INTRAMUSCULAR | Status: DC | PRN

## 2015-03-01 MED ORDER — SODIUM CHLORIDE 0.9 % IJ SOLN
3.0000 mL | Freq: Two times a day (BID) | INTRAMUSCULAR | Status: DC
  Administered 2015-03-02 – 2015-03-05 (×6): 3 mL via INTRAVENOUS

## 2015-03-01 MED ORDER — AMLODIPINE BESYLATE 10 MG PO TABS
10.0000 mg | ORAL_TABLET | Freq: Every day | ORAL | Status: DC
  Administered 2015-03-02 – 2015-03-05 (×4): 10 mg via ORAL
  Filled 2015-03-01 (×4): qty 1

## 2015-03-01 MED ORDER — ADULT MULTIVITAMIN W/MINERALS CH
1.0000 | ORAL_TABLET | Freq: Every day | ORAL | Status: DC
  Administered 2015-03-02 – 2015-03-05 (×4): 1 via ORAL
  Filled 2015-03-01 (×4): qty 1

## 2015-03-01 MED ORDER — SODIUM CHLORIDE 0.9 % IV SOLN: 250.0000 mL | INTRAVENOUS | Status: DC

## 2015-03-01 MED ORDER — POTASSIUM CHLORIDE IN NACL 20-0.9 MEQ/L-% IV SOLN
INTRAVENOUS | Status: DC
  Administered 2015-03-02 (×2): via INTRAVENOUS
  Filled 2015-03-01 (×9): qty 1000

## 2015-03-01 MED ORDER — SODIUM CHLORIDE 0.9 % IJ SOLN: 3.0000 mL | INTRAMUSCULAR | Status: DC | PRN

## 2015-03-01 MED ORDER — PHENOL 1.4 % MT LIQD: 1.0000 | OROMUCOSAL | Status: DC | PRN

## 2015-03-01 MED ORDER — CO Q 10 100 MG PO CAPS: 100.0000 mg | ORAL_CAPSULE | Freq: Every day | ORAL | Status: DC

## 2015-03-01 MED ORDER — SENNOSIDES-DOCUSATE SODIUM 8.6-50 MG PO TABS: 1.0000 | ORAL_TABLET | Freq: Every evening | ORAL | Status: DC | PRN

## 2015-03-01 MED ORDER — CEFAZOLIN SODIUM-DEXTROSE 2-3 GM-% IV SOLR
INTRAVENOUS | Status: DC | PRN
  Administered 2015-03-01 (×3): 2 g via INTRAVENOUS

## 2015-03-01 MED ORDER — DEXAMETHASONE SODIUM PHOSPHATE 10 MG/ML IJ SOLN
INTRAMUSCULAR | Status: AC
  Filled 2015-03-01: qty 1

## 2015-03-01 MED ORDER — DOCUSATE SODIUM 100 MG PO CAPS
100.0000 mg | ORAL_CAPSULE | Freq: Two times a day (BID) | ORAL | Status: DC
  Administered 2015-03-01 – 2015-03-04 (×7): 100 mg via ORAL
  Filled 2015-03-01 (×8): qty 1

## 2015-03-01 MED ORDER — GLYCOPYRROLATE 0.2 MG/ML IJ SOLN
INTRAMUSCULAR | Status: AC
  Filled 2015-03-01: qty 5

## 2015-03-01 MED ORDER — DEXAMETHASONE SODIUM PHOSPHATE 10 MG/ML IJ SOLN
INTRAMUSCULAR | Status: DC | PRN
  Administered 2015-03-01: 10 mg via INTRAVENOUS

## 2015-03-01 MED ORDER — LISINOPRIL 10 MG PO TABS
10.0000 mg | ORAL_TABLET | Freq: Every evening | ORAL | Status: DC
  Administered 2015-03-01 – 2015-03-04 (×4): 10 mg via ORAL
  Filled 2015-03-01 (×4): qty 1

## 2015-03-01 MED ORDER — LIDOCAINE-EPINEPHRINE 0.5 %-1:200000 IJ SOLN
INTRAMUSCULAR | Status: DC | PRN
  Administered 2015-03-01: 30 mL

## 2015-03-01 MED ORDER — NEOSTIGMINE METHYLSULFATE 10 MG/10ML IV SOLN
INTRAVENOUS | Status: DC | PRN
  Administered 2015-03-01: 5 mg via INTRAVENOUS

## 2015-03-01 MED ORDER — ATORVASTATIN CALCIUM 40 MG PO TABS
40.0000 mg | ORAL_TABLET | Freq: Two times a day (BID) | ORAL | Status: DC
  Administered 2015-03-01 – 2015-03-05 (×8): 40 mg via ORAL
  Filled 2015-03-01 (×8): qty 1

## 2015-03-01 SURGICAL SUPPLY — 75 items
APL SKNCLS STERI-STRIP NONHPOA (GAUZE/BANDAGES/DRESSINGS)
BAG DECANTER FOR FLEXI CONT (MISCELLANEOUS) ×3 IMPLANT
BENZOIN TINCTURE PRP APPL 2/3 (GAUZE/BANDAGES/DRESSINGS) IMPLANT
BLADE CLIPPER SURG (BLADE) IMPLANT
BUR MATCHSTICK NEURO 3.0 LAGG (BURR) ×3 IMPLANT
BUR PRECISION FLUTE 5.0 (BURR) ×3 IMPLANT
CAGE CREO RISE 10X30 7-14 EP (Cage) ×5 IMPLANT
CAGE CREO RISE 10X30 7-14MM EP (Cage) ×5 IMPLANT
CANISTER SUCT 3000ML PPV (MISCELLANEOUS) ×3 IMPLANT
CLOSURE WOUND 1/2 X4 (GAUZE/BANDAGES/DRESSINGS)
CONT SPEC 4OZ CLIKSEAL STRL BL (MISCELLANEOUS) ×3 IMPLANT
COVER BACK TABLE 60X90IN (DRAPES) ×2 IMPLANT
DECANTER SPIKE VIAL GLASS SM (MISCELLANEOUS) ×3 IMPLANT
DEVICE RISE INTERBODY CREO (Neuro Prosthesis/Implant) IMPLANT
DRAPE C-ARM 42X72 X-RAY (DRAPES) ×4 IMPLANT
DRAPE C-ARMOR (DRAPES) ×2 IMPLANT
DRAPE LAPAROTOMY 100X72X124 (DRAPES) ×3 IMPLANT
DRAPE POUCH INSTRU U-SHP 10X18 (DRAPES) ×3 IMPLANT
DRAPE SURG 17X23 STRL (DRAPES) ×3 IMPLANT
DRSG OPSITE POSTOP 4X10 (GAUZE/BANDAGES/DRESSINGS) ×2 IMPLANT
DURAPREP 26ML APPLICATOR (WOUND CARE) ×3 IMPLANT
ELECT REM PT RETURN 9FT ADLT (ELECTROSURGICAL) ×3
ELECTRODE REM PT RTRN 9FT ADLT (ELECTROSURGICAL) ×1 IMPLANT
GAUZE SPONGE 4X4 12PLY STRL (GAUZE/BANDAGES/DRESSINGS) IMPLANT
GAUZE SPONGE 4X4 16PLY XRAY LF (GAUZE/BANDAGES/DRESSINGS) ×2 IMPLANT
GLOVE BIO SURGEON STRL SZ8 (GLOVE) ×2 IMPLANT
GLOVE ECLIPSE 6.5 STRL STRAW (GLOVE) ×6 IMPLANT
GLOVE ECLIPSE 7.5 STRL STRAW (GLOVE) ×4 IMPLANT
GLOVE EXAM NITRILE LRG STRL (GLOVE) IMPLANT
GLOVE EXAM NITRILE MD LF STRL (GLOVE) IMPLANT
GLOVE EXAM NITRILE XL STR (GLOVE) IMPLANT
GLOVE EXAM NITRILE XS STR PU (GLOVE) IMPLANT
GLOVE INDICATOR 7.0 STRL GRN (GLOVE) ×2 IMPLANT
GLOVE INDICATOR 7.5 STRL GRN (GLOVE) ×2 IMPLANT
GLOVE INDICATOR 8.0 STRL GRN (GLOVE) ×2 IMPLANT
GLOVE SURG SS PI 7.0 STRL IVOR (GLOVE) ×2 IMPLANT
GOWN STRL REUS W/ TWL LRG LVL3 (GOWN DISPOSABLE) ×2 IMPLANT
GOWN STRL REUS W/ TWL XL LVL3 (GOWN DISPOSABLE) IMPLANT
GOWN STRL REUS W/TWL 2XL LVL3 (GOWN DISPOSABLE) ×2 IMPLANT
GOWN STRL REUS W/TWL LRG LVL3 (GOWN DISPOSABLE) ×6
GOWN STRL REUS W/TWL XL LVL3 (GOWN DISPOSABLE) ×3
KIT BASIN OR (CUSTOM PROCEDURE TRAY) ×3 IMPLANT
KIT INFUSE X SMALL 1.4CC (Orthopedic Implant) ×2 IMPLANT
KIT POSITION SURG JACKSON T1 (MISCELLANEOUS) ×3 IMPLANT
KIT ROOM TURNOVER OR (KITS) ×3 IMPLANT
KNIFE ARACHNOID DISP AM-21-S (BLADE) ×2 IMPLANT
LIQUID BAND (GAUZE/BANDAGES/DRESSINGS) ×3 IMPLANT
NDL HYPO 25X1 1.5 SAFETY (NEEDLE) ×1 IMPLANT
NDL SPNL 18GX3.5 QUINCKE PK (NEEDLE) IMPLANT
NEEDLE HYPO 25X1 1.5 SAFETY (NEEDLE) ×3 IMPLANT
NEEDLE SPNL 18GX3.5 QUINCKE PK (NEEDLE) ×3 IMPLANT
NS IRRIG 1000ML POUR BTL (IV SOLUTION) ×7 IMPLANT
PACK LAMINECTOMY NEURO (CUSTOM PROCEDURE TRAY) ×3 IMPLANT
PAD ARMBOARD 7.5X6 YLW CONV (MISCELLANEOUS) ×9 IMPLANT
RISE INTERBODY CREO (Neuro Prosthesis/Implant) ×3 IMPLANT
ROD 95MM (Rod) ×2 IMPLANT
ROD PREBENT PLIF 90MM (Rod) ×2 IMPLANT
SCREW LOCK (Screw) ×27 IMPLANT
SCREW LOCK FXNS SPNE MAS PL (Screw) IMPLANT
SCREW SHANK 5.5X40MM (Screw) ×4 IMPLANT
SCREW SHANK 6.5X40 (Screw) ×6 IMPLANT
SCREW SHANK 6.5X40 NS LF (Screw) IMPLANT
SCREW SHANKS 5.5X35 (Screw) ×8 IMPLANT
SCREW TULIP 5.5 (Screw) ×16 IMPLANT
SPONGE LAP 4X18 X RAY DECT (DISPOSABLE) ×4 IMPLANT
SPONGE SURGIFOAM ABS GEL 100 (HEMOSTASIS) ×5 IMPLANT
STRIP CLOSURE SKIN 1/2X4 (GAUZE/BANDAGES/DRESSINGS) IMPLANT
SUT PROLENE 6 0 BV (SUTURE) IMPLANT
SUT VIC AB 0 CT1 18XCR BRD8 (SUTURE) ×1 IMPLANT
SUT VIC AB 0 CT1 8-18 (SUTURE) ×9
SUT VIC AB 2-0 CT1 18 (SUTURE) ×5 IMPLANT
SUT VIC AB 3-0 SH 8-18 (SUTURE) ×5 IMPLANT
TOWEL OR 17X24 6PK STRL BLUE (TOWEL DISPOSABLE) ×3 IMPLANT
TOWEL OR 17X26 10 PK STRL BLUE (TOWEL DISPOSABLE) ×3 IMPLANT
WATER STERILE IRR 1000ML POUR (IV SOLUTION) ×3 IMPLANT

## 2015-03-01 NOTE — Op Note (Signed)
03/01/2015  7:02 PM  PATIENT:  William Gill  59 y.o. male with severe degenerative disc disease, lumbar scoliosis, and arthritis causing fairly severe foraminal stenosis at the L3/4,4/5, and L5/S1. We have tried injections for a number of years with success. At this time though the effectiveness of the injections is no longer satisfactory. William Gill has opted for lumbar decompression and arthrodesis.   PRE-OPERATIVE DIAGNOSIS:  lumbar spondylolisthesis,lumbar scoliosis,osteoarthritis with radiculopathy  POST-OPERATIVE DIAGNOSIS:lumbar spondylolisthesis,lumbar scoliosis,osteoarthritis with radiculopathy  PROCEDURE:  Procedure(s): lumbar three-four,Lumbar four-five Lumbar five -sacral one posterior lumbar interbody fusion with interbody prothesis(Rise globus expandable cages five 8-62mmx 9mm, and 1 18mm cage right side L3/4), morselized local autograft Lumbar L3,4,and 5 laminectomies for decompression well beyond the exposure needed for a Plif ,posterior lateral arthrodesis L3-S1 with morselized local autograft  posterior segmental instrumentation L3-S1 with pedicle screws(nuvasive mas plif) Correction of scoliosis L3-S1  SURGEON:  Surgeon(s): Ashok Pall, MD Eustace Moore, MD  ASSISTANTS:Jones, Shanon Brow  ANESTHESIA:   general  EBL:     BLOOD ADMINISTERED:400 CC CELLSAVER  CELL SAVER GIVEN:yes  COUNT:per nursing  DRAINS: none   SPECIMEN:  No Specimen  DICTATION: William Gill is a 59 y.o. male whom was taken to the operating room intubated, and placed under a general anesthetic without difficulty. A foley catheter was placed under sterile conditions. He was positioned prone on a Jackson stable with all pressure points properly padded.  His lumbar region was prepped and draped in a sterile manner. I infiltrated 30cc's 1/2%lidocaine/1:2000,000 strength epinephrine into the planned incision. I opened the skin with a 10 blade and took the incision down to the thoracolumbar fascia. I  exposed the lamina of L2,3,4,5, and S1 in a subperiosteal fashion bilaterally. I confirmed my location with an intraoperative xray.  I placed self retaining retractors and started the decompression.  I decompressed the spinal canal and the L3,4,5 and S1 roots bilaterally via laminectomies and facetectomies at L3/4,4/5, and L5/S1. Inferior complete facetectomies completed with the drill and Kerrison punches at L3,4,and L5 bilateterally. The facetectomies,along with the complete laminectomies of L3,4, and L5 allowed for complete decompression of the L3,4,and L5 roots in the spinal canal and in the neural foramina bilaterally. I then moved on to the posterior lumbar interbody arthrodeses. PLIF's were performed at 3/4,4/5 and L5/S1 in the same fashion. I opened the disc space with a 15 blade then used a variety of instruments to remove the disc and prepare the space for the arthrodesis. I used curettes, rongeurs, punches, shavers for the disc space, and rasps in the discetomy. I placed expandable  Peek cages(Globus Rise) into the disc space(s) so that we would be able to correct the scoliosis to some degree. With Ap and Lateral fluoro Dr. Ronnald Ramp and I expanded the cages so that we had corrected the coronal curve. I decorticated the lateral bone at L3,4,5, and S1. I then placed morselized autograft  on the decorticated surfaces to complete the posterolateral arthrodesis.  We placed pedicle screws at L3,4,5, and S1, using fluoroscopic guidance. I drilled a pilot hole, then cannulated the pedicle with a drill at L3,4, and 5. At S1 I used a bone probe to create the desired trajectories.  I then tapped each pedicle, assessing each site for pedicle violations. No cutouts were appreciated. Screws (mas plif nuvasive) were then placed at each site without difficulty. Rods were placed into the screw heads and locked into position with screw on caps. Final films were performed and all  screws appeared to be in good position.  I  closed the wound in a layered fashion. I approximated the thoracolumbar fascia, subcutaneous, and subcuticular planes with vicryl sutures. I used dermabond and an occlusive bandage for a sterile dressing.     PLAN OF CARE: Admit to inpatient   PATIENT DISPOSITION:  PACU - hemodynamically stable.   Delay start of Pharmacological VTE agent (>24hrs) due to surgical blood loss or risk of bleeding:  yes

## 2015-03-01 NOTE — Anesthesia Preprocedure Evaluation (Addendum)
Anesthesia Evaluation  Patient identified by MRN, date of birth, ID band Patient awake    Reviewed: Allergy & Precautions, NPO status , Patient's Chart, lab work & pertinent test results  Airway Mallampati: II  TM Distance: >3 FB Neck ROM: Full    Dental no notable dental hx. (+) Teeth Intact, Dental Advidsory Given   Pulmonary neg pulmonary ROS, former smoker,    Pulmonary exam normal breath sounds clear to auscultation       Cardiovascular hypertension, Pt. on medications Normal cardiovascular exam Rhythm:Regular Rate:Normal     Neuro/Psych negative neurological ROS  negative psych ROS   GI/Hepatic negative GI ROS, Neg liver ROS, GERD  Medicated and Controlled,  Endo/Other  negative endocrine ROS  Renal/GU negative Renal ROS  negative genitourinary   Musculoskeletal negative musculoskeletal ROS (+)   Abdominal   Peds negative pediatric ROS (+)  Hematology negative hematology ROS (+)   Anesthesia Other Findings   Reproductive/Obstetrics negative OB ROS                            Anesthesia Physical Anesthesia Plan  ASA: II  Anesthesia Plan: General   Post-op Pain Management:    Induction: Intravenous  Airway Management Planned: Oral ETT  Additional Equipment: Arterial line  Intra-op Plan:   Post-operative Plan: Extubation in OR  Informed Consent: I have reviewed the patients History and Physical, chart, labs and discussed the procedure including the risks, benefits and alternatives for the proposed anesthesia with the patient or authorized representative who has indicated his/her understanding and acceptance.   Dental advisory given and Dental Advisory Given  Plan Discussed with: CRNA, Surgeon and Anesthesiologist  Anesthesia Plan Comments:        Anesthesia Quick Evaluation

## 2015-03-01 NOTE — Transfer of Care (Signed)
Immediate Anesthesia Transfer of Care Note  Patient: William Gill  Procedure(s) Performed: Procedure(s): lumbar three-four,Lumbar four-five Lumbar five -sacral one posterior lumbar interbody fusion with interbody prothesis,posterior lateral arthrodesis and posterior segmental instrumentation (N/A)  Patient Location: PACU  Anesthesia Type:General  Level of Consciousness: awake, alert , oriented and patient cooperative  Airway & Oxygen Therapy: Patient Spontanous Breathing and Patient connected to face mask oxygen  Post-op Assessment: Report given to RN, Post -op Vital signs reviewed and stable and Patient moving all extremities X 4  Post vital signs: Reviewed and stable  Last Vitals:  Filed Vitals:   03/01/15 0657  BP: 132/67  Pulse: 72  Temp: 36.7 C  Resp: 20    Complications: No apparent anesthesia complications

## 2015-03-01 NOTE — Progress Notes (Signed)
Orthopedic Tech Progress Note Patient Details:  William Gill Aug 31, 1955 [id number] Called [employer] for brace order. Patient ID: William Gill, male   DOB: 11-10-1955, 59 y.o.   MRN: [id number]   [name] 03/01/2015, 7:44 PM

## 2015-03-01 NOTE — Plan of Care (Signed)
Problem: Consults Goal: Diagnosis - Spinal Surgery Outcome: Completed/Met Date Met:  03/01/15 Thoraco/Lumbar Spine Fusion     

## 2015-03-01 NOTE — Anesthesia Postprocedure Evaluation (Signed)
  Anesthesia Post-op Note  Patient: William Gill  Procedure(s) Performed: Procedure(s): lumbar three-four,Lumbar four-five Lumbar five -sacral one posterior lumbar interbody fusion with interbody prothesis,posterior lateral arthrodesis and posterior segmental instrumentation (N/A)  Patient Location: PACU  Anesthesia Type:General  Level of Consciousness: awake and alert   Airway and Oxygen Therapy: Patient Spontanous Breathing  Post-op Pain: Controlled  Post-op Assessment: Post-op Vital signs reviewed, Patient's Cardiovascular Status Stable and Respiratory Function Stable  Post-op Vital Signs: Reviewed  Filed Vitals:   03/01/15 2045  BP:   Pulse: 92  Temp:   Resp: 28    Complications: No apparent anesthesia complications

## 2015-03-01 NOTE — Anesthesia Procedure Notes (Signed)
Procedure Name: Intubation Date/Time: 03/01/2015 9:27 AM Performed by: Neldon Newport Pre-anesthesia Checklist: Patient being monitored, Suction available, Emergency Drugs available, Patient identified and Timeout performed Patient Re-evaluated:Patient Re-evaluated prior to inductionOxygen Delivery Method: Circle system utilized Preoxygenation: Pre-oxygenation with 100% oxygen Intubation Type: IV induction Ventilation: Mask ventilation without difficulty Laryngoscope Size: Mac and 3 Grade View: Grade II Tube type: Oral Tube size: 7.5 mm Number of attempts: 1 Placement Confirmation: positive ETCO2,  ETT inserted through vocal cords under direct vision and breath sounds checked- equal and bilateral Secured at: 23 cm Tube secured with: Tape Dental Injury: Teeth and Oropharynx as per pre-operative assessment

## 2015-03-01 NOTE — Progress Notes (Signed)
PHARMACIST - PHYSICIAN ORDER COMMUNICATION  CONCERNING: P&T Medication Policy on Herbal Medications  DESCRIPTION:  This patient's order for:  CoQ10  has been noted.  This product(s) is classified as an "herbal" or natural product. Due to a lack of definitive safety studies or FDA approval, nonstandard manufacturing practices, plus the potential risk of unknown drug-drug interactions while on inpatient medications, the Pharmacy and Therapeutics Committee does not permit the use of "herbal" or natural products of this type within [place].   ACTION TAKEN: The pharmacy department is unable to verify this order at this time and your patient has been informed of this safety policy. Please reevaluate patient's clinical condition at discharge and address if the herbal or natural product(s) should be resumed at that time.  Koden Hunzeker S. Makenzie Weisner, PharmD, BCPS Clinical Staff Pharmacist Pager [phone]   

## 2015-03-01 NOTE — Progress Notes (Signed)
Patient admitted from PACU via 2 nurses with VSS and reporting pain. WIll administer pain medicine and continue to monitor. Joaquin Bend E, [place] 03/01/2015 2123

## 2015-03-02 NOTE — Progress Notes (Signed)
Patient states that he has tried to urinate three times, is only getting a scant amount out. Performed bladder scan, shows 755 mL. Will contact Dr

## 2015-03-02 NOTE — Progress Notes (Addendum)
Patient states that he normally has two bowel movements a day and is constipated.  Gave PRN Dulcolax along with some coffee.  He is also passing gas.  States his pain level is at a 7 but does not want anything stronger than Tylenol.  Told him that his pain may get out of control. He will ask for something stronger later on if the Tylenol does not help.

## 2015-03-02 NOTE — Evaluation (Addendum)
Physical Therapy Evaluation Patient Details Name: William Gill MRN: [id number] DOB: [birth year] Today's Date: 03/02/2015   History of Present Illness  Patient is a 59 yo male s/p lumbar three-four,Lumbar four-five Lumbar five -sacral one posterior lumbar interbody fusion.   Clinical Impression  Patient demonstrates deficits in functional mobility as indicated below. Will benefit from continued skilled PT to address deficits and maximize function. Will see as indicated and progress as tolerated.     Follow Up Recommendations No PT follow up;Supervision - Intermittent    Equipment Recommendations  None recommended by PT    Recommendations for Other Services       Precautions / Restrictions Precautions Precautions: Back Precaution Booklet Issued: Yes (comment) Precaution Comments: Verbally reviewed with patient Required Braces or Orthoses: Spinal Brace Spinal Brace: Lumbar corset Restrictions Weight Bearing Restrictions: No      Mobility  Bed Mobility               General bed mobility comments: received sitting EOB  Transfers Overall transfer level: Needs assistance Equipment used: None Transfers: Sit to/from Stand Sit to Stand: Min guard            Ambulation/Gait Ambulation/Gait assistance: Supervision Ambulation Distance (Feet): 380 Feet Assistive device: Rolling walker (2 wheeled);None (RW for 90 ft then no RW the remainder of the distance) Gait Pattern/deviations: Step-through pattern;Decreased stride length Gait velocity: initially decreased Gait velocity interpretation: Below normal speed for age/gender General Gait Details: Patient mobilizing well, required VCs for increased cadence during ambulation. Tolerated ambulation with and without RW.   Stairs            Wheelchair Mobility    Modified Rankin (Stroke Patients Only)       Balance Overall balance assessment: No apparent balance deficits (not formally assessed)                                            Pertinent Vitals/Pain Pain Assessment: 0-10 Pain Score: 6  Pain Location: low back Pain Descriptors / Indicators: Operative site guarding Pain Intervention(s): Monitored during session;Repositioned    Home Living Family/patient expects to be discharged to:: Private residence Living Arrangements: Spouse/significant other   Type of Home: House Home Access: Stairs to enter Entrance Stairs-Rails: Right Entrance Stairs-Number of Steps: 2 Home Layout: One level (basement (washer/dryer)) Home Equipment: Other (comment) (wife has rollator)      Prior Function Level of Independence: Independent               Hand Dominance   Dominant Hand: Right    Extremity/Trunk Assessment   Upper Extremity Assessment: Overall WFL for tasks assessed           Lower Extremity Assessment: Overall WFL for tasks assessed         Communication      Cognition Arousal/Alertness: Awake/alert Behavior During Therapy: WFL for tasks assessed/performed Overall Cognitive Status: Within Functional Limits for tasks assessed                      General Comments General comments (skin integrity, edema, etc.): Educated patient extensively regarding precautions, postional changes, pain management strategies, and proper use of spinal brace.    Exercises        Assessment/Plan    PT Assessment Patient needs continued PT services  PT Diagnosis Difficulty walking;Abnormality of gait;Acute pain  PT Problem List Decreased strength;Decreased activity tolerance;Decreased balance;Decreased mobility;Pain;Decreased knowledge of precautions  PT Treatment Interventions DME instruction;Gait training;Stair training;Functional mobility training;Therapeutic activities;Therapeutic exercise;Balance training;Patient/family education   PT Goals (Current goals can be found in the Care Plan section) Acute Rehab PT Goals Patient Stated Goal: to go home PT  Goal Formulation: With patient Time For Goal Achievement: 03/16/15 Potential to Achieve Goals: Good    Frequency Min 5X/week   Barriers to discharge        Co-evaluation               End of Session Equipment Utilized During Treatment: Gait belt;Back brace Activity Tolerance: Patient tolerated treatment well Patient left: in chair;with call bell/phone within reach Nurse Communication: Mobility status;Precautions         Time: 2518-9842 PT Time Calculation (min) (ACUTE ONLY): 31 min   Charges:   PT Evaluation $Initial PT Evaluation Tier I: 1 Procedure PT Treatments $Gait Training: 8-22 mins   PT G CodesDuncan Dull 15-Mar-2015, 9:38 AM Alben Deeds, PT DPT  [phone]

## 2015-03-02 NOTE — Progress Notes (Signed)
Patient sitting in chair.  Patient was assisted into the bathroom by the tech.  However he was walking back to the chair when I walked by.  Told the patient it was very important that he call for assistance before doing any walking.

## 2015-03-02 NOTE — Progress Notes (Addendum)
Patient sitting on side of bed eating lunch.  States his bladder pain is gone. Back pain has decreased. Does not want SCD's back on at this time.

## 2015-03-02 NOTE — Progress Notes (Signed)
Patient sitting on side of bed taking a bath.  Pain medication given, PT present and equipment with his brace.

## 2015-03-02 NOTE — Progress Notes (Signed)
Patient lying in bed after OT visit.  Pain decreased to a 6

## 2015-03-02 NOTE — Progress Notes (Signed)
Subjective: Patient reports sore in back, but legs better  Objective: Vital signs in last 24 hours: Temp:  [97.7 F (36.5 C)-98 F (36.7 C)] 97.9 F (36.6 C) (09/10 0528) Pulse Rate:  [72-96] 94 (09/10 0528) Resp:  [8-28] 18 (09/10 0528) BP: (97-132)/(46-67) 111/51 mmHg (09/10 0528) SpO2:  [94 %-100 %] 98 % (09/10 0528) Arterial Line BP: (91-126)/(44-64) 126/62 mmHg (09/09 2030) Weight:  [99.791 kg (220 lb)] 99.791 kg (220 lb) (09/09 0657)  Intake/Output from previous day: 09/09 0701 - 09/10 0700 In: 5700 [P.O.:120; I.V.:4950; Blood:130; IV Piggyback:500] Out: 6010 [Urine:5560; Blood:450] Intake/Output this shift: Total I/O In: 420 [P.O.:120; I.V.:300] Out: 3350 [Urine:3350]  Physical Exam: Strength full.  No numbness.  Dressing CDI.  Lab Results: No results for input(s): WBC, HGB, HCT, PLT in the last 72 hours. BMET No results for input(s): NA, K, CL, CO2, GLUCOSE, BUN, CREATININE, CALCIUM in the last 72 hours.  Studies/Results: Dg Lumbar Spine 2-3 Views  03/01/2015   CLINICAL DATA:  Back pain  EXAM: DG C-ARM GT 120 MIN; LUMBAR SPINE - 2-3 VIEW  FLUOROSCOPY TIME:  If the device does not provide the exposure index:  Fluoroscopy Time (in minutes and seconds):  4 minutes and 5 seconds.  Number of Acquired Images:  2  COMPARISON:  None.  FINDINGS: Pedicle screws and interbody cages have been placed in preparation for L3 through S1 fusion.  IMPRESSION: As above.   Electronically Signed   By: Staci Righter M.D.   On: 03/01/2015 18:41   Dg C-arm Gt 120 Min  03/01/2015   CLINICAL DATA:  Back pain  EXAM: DG C-ARM GT 120 MIN; LUMBAR SPINE - 2-3 VIEW  FLUOROSCOPY TIME:  If the device does not provide the exposure index:  Fluoroscopy Time (in minutes and seconds):  4 minutes and 5 seconds.  Number of Acquired Images:  2  COMPARISON:  None.  FINDINGS: Pedicle screws and interbody cages have been placed in preparation for L3 through S1 fusion.  IMPRESSION: As above.   Electronically Signed    By: Staci Righter M.D.   On: 03/01/2015 18:41    Assessment/Plan: Doing well.  Mobilize with PT.    LOS: 1 day    Peggyann Shoals, MD 03/02/2015, 6:42 AM

## 2015-03-02 NOTE — Progress Notes (Signed)
Patient sitting up in chair after PT eval.  Pain at an 8. Percocet given before OT evaluation.

## 2015-03-02 NOTE — Progress Notes (Signed)
Patient moved back from chair to bed. Family at bedside, SCD's back on

## 2015-03-03 NOTE — Progress Notes (Addendum)
Occupational Therapy Treatment Patient Details Name: William Gill MRN: [id number] DOB: 11/21/1955 Today's Date: 03/03/2015    History of present illness Patient is a 59 y.o. male s/p lumbar three-four,Lumbar four-five Lumbar five -sacral one posterior lumbar interbody fusion with interbody prothesis,posterior lateral arthrodesis and posterior segmental instrumentation    OT comments  Education provided in session. Pt verbalized he felt good about information covered in session. Did not feel need for additional session with wife present. OT signing off.  Follow Up Recommendations  No OT follow up;Supervision - Intermittent   Equipment Recommendations  None recommended by OT    Recommendations for Other Services      Precautions / Restrictions Precautions Precautions: Back Precaution Booklet Issued: No Precaution Comments: pt able to state 2/3 precautions; reviewed Required Braces or Orthoses: Spinal Brace Spinal Brace: Lumbar corset;Applied in sitting position Restrictions Weight Bearing Restrictions: No       Mobility Bed Mobility Overal bed mobility: Needs Assistance Bed Mobility: Rolling;Sidelying to Sit Rolling: Supervision Sidelying to sit: Supervision       General bed mobility comments: cues for technique. Practiced with HOB flat and no rail.  Transfers Overall transfer level: Needs assistance Equipment used: None Transfers: Sit to/from Stand Sit to Stand: Supervision              Balance  No LOB in session.                                 ADL Overall ADL's : Needs assistance/impaired          Eating/Feeding: Appeared to take a sip of coffee when standing-independent         Upper Body Dressing : Set up;Supervision/safety;Sitting;Standing Upper Body Dressing Details (indicate cue type and reason): back brace Lower Body Dressing: Set up;Sitting/lateral leans   Toilet Transfer: Supervision/safety;Ambulation (sit to stand from  bed)           Functional mobility during ADLs: Supervision/safety General ADL Comments: Discussed incorporating precautions into functional activities. Educated on AE including toilet aide for hygiene and what he could use for this (also suggested wipes). Educuated on back brace. Discussed benefit of mobility to help him have BM.  Educated on tub transfer technique. Educated on safety such as sitting for LB bathing, safe footwear, rugs/items on floor, and recommended someone be with him for tub transfer.      Vision                     Perception     Praxis      Cognition  Awake/Alert Behavior During Therapy: WFL for tasks assessed/performed Overall Cognitive Status: Within Functional Limits for tasks assessed       Memory:  (able to state 2/3 precautions)               Extremity/Trunk Assessment               Exercises     Shoulder Instructions       General Comments      Pertinent Vitals/ Pain       Pain Assessment: 0-10 Pain Score: 7  Pain Location: back Pain Descriptors / Indicators: Constant Pain Intervention(s): Monitored during session;Repositioned  Home Living  Prior Functioning/Environment              Frequency Min 2X/week     Progress Toward Goals  OT Goals(current goals can now be found in the care plan section)  Progress towards OT goals: Progressing toward goals-adequate for d/c  Acute Rehab OT Goals Patient Stated Goal: get back to what he was doing before OT Goal Formulation: With patient Time For Goal Achievement: 03/09/15 Potential to Achieve Goals: Good ADL Goals Pt Will Transfer to Toilet: with modified independence;ambulating;bedside commode Pt Will Perform Toileting - Clothing Manipulation and hygiene: with supervision;sit to/from stand;with adaptive equipment Additional ADL Goal #1: Pt/wife will verbalize understanding of back precautions for ADL  and mobility with DME and AE  Plan Discharge plan needs to be updated    Co-evaluation                 End of Session Equipment Utilized During Treatment: Gait belt;Back brace   Activity Tolerance Patient tolerated treatment well   Patient Left in chair;with call bell/phone within reach   Nurse Communication          Time: 4098-1191 OT Time Calculation (min): 17 min  Charges: OT General Charges $OT Visit: 1 Procedure OT Treatments $Self Care/Home Management : 8-22 mins    Benito Mccreedy  OTR/L [phone]  03/03/2015, 9:07 AM

## 2015-03-03 NOTE — Progress Notes (Signed)
Patient given Dilaudid and two containers of prune juice for constipation.

## 2015-03-03 NOTE — Progress Notes (Signed)
Patient still unable to go to the bathroom.  Gave Mag Citrate.  Left SCD's off so patient can go to the bathroom since he is now independent.

## 2015-03-03 NOTE — Progress Notes (Signed)
Patient sitting up in chair, no needs expressed at this time

## 2015-03-03 NOTE — Progress Notes (Signed)
Utilization Review Completed.William Gill T9/04/2015  

## 2015-03-03 NOTE — Progress Notes (Signed)
Overall he is doing fairly well. He has appropriate back soreness but no leg pain. He is moving his legs well. His big complaint is constipation. He does have flatus. Continue to mobilize. Trial of magnesium citrate today. Overall I think he is making the expected recovery.

## 2015-03-03 NOTE — Progress Notes (Signed)
OT Evaluation   Making good progress. Will need to educate wife on back precautions and follow to address established OT goals.   03/02/15 1300  OT Visit Information  Last OT Received On 03/02/15  Assistance Needed +1  History of Present Illness Patient is a 59 yo male s/p lumbar three-four,Lumbar four-five Lumbar five -sacral one posterior lumbar interbody fusion with interbody prothesis,posterior lateral arthrodesis and posterior segmental instrumentation   Precautions  Precautions Back  Precaution Booklet Issued Yes (comment)  Precaution Comments Verbally reviewed with patient  Required Braces or Orthoses Spinal Brace  Spinal Brace Lumbar corset  Restrictions  Weight Bearing Restrictions No  Home Living  Family/patient expects to be discharged to: Private residence  Living Arrangements Spouse/significant other  Available Help at Discharge Family;Available 24 hours/day  Type of Home House  Home Access Stairs to enter  Entrance Stairs-Number of Steps 2  Entrance Stairs-Rails Right  Home Layout One level (basement (washer/dryer))  Corporate investment banker Handicapped height  Bathroom Accessibility Yes  How Accessible Accessible via walker  [place] Other (comment);Shower seat (wife has rollator)  Prior Function  Level of Independence Independent  Communication  Communication No difficulties  Pain Assessment  Pain Assessment 0-10  Pain Score 8  Pain Location back  Pain Descriptors / Indicators Aching;Sore  Pain Intervention(s) Limited activity within patient's tolerance;Monitored during session  Cognition  Arousal/Alertness Awake/alert  Behavior During Therapy WFL for tasks assessed/performed  Overall Cognitive Status Within Functional Limits for tasks assessed  Upper Extremity Assessment  Upper Extremity Assessment Overall WFL for tasks assessed  Lower Extremity Assessment  Lower Extremity Assessment Defer to PT evaluation  Cervical  / Trunk Assessment  Cervical / Trunk Assessment Normal  ADL  Overall ADL's  Needs assistance/impaired  Grooming Supervision/safety  Upper Body Bathing Set up;Supervision/ safety;Sitting  Lower Body Bathing Minimal assistance;Sit to/from stand  Upper Body Dressing  Set up;Supervision/safety;Sitting  Lower Body Dressing Minimal assistance;Sit to/from Field seismologist- Water quality scientist and Hygiene Minimal assistance  Functional mobility during ADLs Min guard  General ADL Comments Began education on compensatory techniques and use of AE for LB ADL. Pt ahs DME needed for safe D/C. Pt practiced with reacher for LB dressing. Will need to educate pt on use of AE if needed for hygiene after toileting. Requests OT to educate wife.   Bed Mobility  Overal bed mobility Needs Assistance  Bed Mobility Sidelying to Sit;Sit to Sidelying  Sidelying to sit Supervision  Sit to sidelying Supervision  General bed mobility comments educated pt on proper bed mobility techniques  Transfers  Overall transfer level Needs assistance  Equipment used None  Transfers Sit to/from Stand  Sit to Stand Min guard  Balance  Overall balance assessment No apparent balance deficits (not formally assessed)  OT - End of Session  Equipment Utilized During Treatment Gait belt  Activity Tolerance Patient tolerated treatment well  Patient left in bed;with call bell/phone within reach;with bed alarm set  Nurse Communication Mobility status  OT Assessment  OT Therapy Diagnosis  Generalized weakness;Acute pain  OT Recommendation/Assessment Patient needs continued OT Services  OT Problem List Decreased strength;Decreased safety awareness;Decreased knowledge of use of DME or AE;Pain;Decreased knowledge of precautions  OT Plan  OT Frequency (ACUTE ONLY) Min 2X/week  OT Treatment/Interventions (ACUTE ONLY) Self-care/ADL training;DME and/or AE instruction;Therapeutic  activities;Patient/family education  OT Recommendation  Follow Up Recommendations No OT follow up;Supervision/Assistance - 24 hour (initially)  OT Equipment None  recommended by OT  Individuals Consulted  Consulted and Agree with Results and Recommendations Patient  Acute Rehab OT Goals  Patient Stated Goal to go home  OT Goal Formulation With patient  Time For Goal Achievement 03/09/15  Potential to Achieve Goals Good  OT Time Calculation  OT Start Time (ACUTE ONLY) 1045  OT Stop Time (ACUTE ONLY) 1120  OT Time Calculation (min) 35 min  OT General Charges  $OT Visit 1 Procedure  OT Evaluation  $Initial OT Evaluation Tier I 1 Procedure  OT Treatments  $Self Care/Home Management  8-22 mins  Written Expression  Dominant Hand Right  Maurie Boettcher, OTR/L  [phone] 03/03/2015

## 2015-03-03 NOTE — Progress Notes (Signed)
Physical Therapy Treatment Patient Details Name: William Gill MRN: [id number] DOB: 02/18/1956 Today's Date: 03/03/2015    History of Present Illness Patient is a 59 yo male s/p lumbar three-four,Lumbar four-five Lumbar five -sacral one posterior lumbar interbody fusion with interbody prothesis,posterior lateral arthrodesis and posterior segmental instrumentation     PT Comments    Continues to make great progress towards functional goals. No loss of balance during gait, not requiring an assistive device with therapy. Safely completed stair training without difficulty. Adequate for d/c from PT standpoint when medically ready.  Follow Up Recommendations  No PT follow up;Supervision - Intermittent     Equipment Recommendations  None recommended by PT    Recommendations for Other Services       Precautions / Restrictions Precautions Precautions: Back Precaution Comments: Recalls 3/3 precautions Required Braces or Orthoses: Spinal Brace Spinal Brace: Lumbar corset Restrictions Weight Bearing Restrictions: No    Mobility  Bed Mobility Overal bed mobility: Needs Assistance Bed Mobility: Rolling;Sidelying to Sit Rolling: Supervision Sidelying to sit: Supervision       General bed mobility comments: Performed log roll with supervision. VC for technique.  Transfers Overall transfer level: Needs assistance Equipment used: None Transfers: Sit to/from Stand Sit to Stand: Supervision         General transfer comment: Steady upon standing. Practiced from [facility] without arm rests, and bed. Maintains straight back and does not need assist.  Ambulation/Gait Ambulation/Gait assistance: Supervision Ambulation Distance (Feet): 300 Feet Assistive device: None Gait Pattern/deviations: Step-through pattern;Decreased stride length;Wide base of support Gait velocity: decreased Gait velocity interpretation: Below normal speed for age/gender General Gait Details: Slightly guarded but  steady without an assistive device. No loss of balance noted during ambulatory bout. Cues for safety awareness.   Stairs Stairs: Yes Stairs assistance: Supervision Stair Management: Step to pattern;Forwards;Two rails;One rail Right;One rail Left;Alternating pattern Number of Stairs: 12 General stair comments: VC for sequencing and technique. Single rail similar to home enviornment. No loss of balance or buckling during this task. States he feels confident. Also practiced full flight for basement entry, pt has 2 rails in this location. Cues for step to vs step through patterns for safety.  Wheelchair Mobility    Modified Rankin (Stroke Patients Only)       Balance Overall balance assessment: Needs assistance Sitting-balance support: No upper extremity supported;Feet supported Sitting balance-Leahy Scale: Normal     Standing balance support: No upper extremity supported Standing balance-Leahy Scale: Good                      Cognition Arousal/Alertness: Awake/alert Behavior During Therapy: WFL for tasks assessed/performed Overall Cognitive Status: Within Functional Limits for tasks assessed                      Exercises      General Comments General comments (skin integrity, edema, etc.): Reviewed safety with mobility at home.      Pertinent Vitals/Pain Pain Assessment: 0-10 Pain Score:  ("I'm sore, but really just want to use the bathroom") Pain Location: back, abdomen Pain Descriptors / Indicators: Sore Pain Intervention(s): Monitored during session;Repositioned    Home Living                      Prior Function            PT Goals (current goals can now be found in the care plan section) Acute Rehab PT Goals Patient  Stated Goal: to go home PT Goal Formulation: With patient Time For Goal Achievement: 03/16/15 Potential to Achieve Goals: Good Progress towards PT goals: Progressing toward goals    Frequency  Min 5X/week    PT  Plan Current plan remains appropriate    Co-evaluation             End of Session Equipment Utilized During Treatment: Back brace Activity Tolerance: Patient tolerated treatment well Patient left: with call bell/phone within reach;in bed     Time: 1610-9604 PT Time Calculation (min) (ACUTE ONLY): 14 min  Charges:  $Gait Training: 8-22 mins                    G Codes:      Ellouise Newer 2015-03-21, 2:58 PM Camille Bal [place], [place]

## 2015-03-04 MED FILL — Heparin Sodium (Porcine) Inj 1000 Unit/ML: INTRAMUSCULAR | Qty: 30 | Status: AC

## 2015-03-04 MED FILL — Sodium Chloride IV Soln 0.9%: INTRAVENOUS | Qty: 1000 | Status: AC

## 2015-03-04 NOTE — Progress Notes (Signed)
Patient ID: ALP GOLDWATER, male   DOB: 03/09/1956, 59 y.o.   MRN: [id number] BP 130/56 mmHg  Pulse 93  Temp(Src) 98.9 F (37.2 C) (Oral)  Resp 20  Ht 6\' 2"  (1.88 m)  Wt 99.791 kg (220 lb)  BMI 28.23 kg/m2  SpO2 100% Alert and oriented, speech is clear and fluent Moving all extremities well Wound is clean,dry, and without signs of infection Will discharge tomorrow.

## 2015-03-04 NOTE — Progress Notes (Signed)
Physical Therapy Treatment Patient Details Name: William Gill MRN: [id number] DOB: [birth year] Today's Date: 03/04/2015    History of Present Illness Patient is a 59 yo male s/p lumbar three-four,Lumbar four-five Lumbar five -sacral one posterior lumbar interbody fusion with interbody prothesis,posterior lateral arthrodesis and posterior segmental instrumentation     PT Comments    Pt functioning at modified independent and is independent with recall of 3/3 precautions. Pt no longer needs acute PT services and is safe to d/c home with spouse once medically cleared.  Follow Up Recommendations  No PT follow up;Supervision - Intermittent     Equipment Recommendations  None recommended by PT    Recommendations for Other Services       Precautions / Restrictions Precautions Precautions: Back Precaution Booklet Issued: No Precaution Comments: recalls 3/3 precautions Required Braces or Orthoses: Spinal Brace Spinal Brace: Applied in sitting position;Lumbar corset (pt independent with donning/doffing) Restrictions Weight Bearing Restrictions: No    Mobility  Bed Mobility               General bed mobility comments: pt received sitting EOB  Transfers Overall transfer level: Modified independent Equipment used: None Transfers: Sit to/from Stand Sit to Stand: Modified independent (Device/Increase time)         General transfer comment: steady, no episode of LOB  Ambulation/Gait Ambulation/Gait assistance: Modified independent (Device/Increase time) Ambulation Distance (Feet): 300 Feet Assistive device: None Gait Pattern/deviations: Step-through pattern Gait velocity: decreased Gait velocity interpretation: at or above normal speed for age/gender General Gait Details: steady   Stairs Stairs: Yes Stairs assistance: Modified independent (Device/Increase time) Stair Management: Alternating pattern;Step to pattern Number of Stairs: 12 General stair comments: pt  with safe negotiation, no increased pain  Wheelchair Mobility    Modified Rankin (Stroke Patients Only)       Balance           Standing balance support: No upper extremity supported Standing balance-Leahy Scale: Good                      Cognition Arousal/Alertness: Awake/alert Behavior During Therapy: WFL for tasks assessed/performed Overall Cognitive Status: Within Functional Limits for tasks assessed                      Exercises      General Comments        Pertinent Vitals/Pain Pain Assessment: 0-10 Pain Score: 2  Pain Location: back Pain Descriptors / Indicators: Sore    Home Living                      Prior Function            PT Goals (current goals can now be found in the care plan section) Acute Rehab PT Goals Patient Stated Goal: to go home Progress towards PT goals: Goals met/education completed, patient discharged from PT    Frequency   (pt d/c'd)    PT Plan Frequency needs to be updated    Co-evaluation             End of Session Equipment Utilized During Treatment: Back brace Activity Tolerance: Patient tolerated treatment well Patient left: with call bell/phone within reach;in bed     Time: 1023-1040 PT Time Calculation (min) (ACUTE ONLY): 17 min  Charges:  $Gait Training: 8-22 mins                    G  Codes:      Kingsley Callander 03/04/2015, 12:57 PM  Kittie Plater, PT, DPT Pager #: [phone] Office #: [phone]

## 2015-03-05 MED ORDER — OXYCODONE-ACETAMINOPHEN 5-325 MG PO TABS: 1.0000 | ORAL_TABLET | Freq: Four times a day (QID) | ORAL | Status: DC | PRN

## 2015-03-05 MED ORDER — CYCLOBENZAPRINE HCL 10 MG PO TABS: 10.0000 mg | ORAL_TABLET | Freq: Three times a day (TID) | ORAL | Status: AC | PRN

## 2015-03-05 NOTE — Progress Notes (Signed)
Patient is discharged from room 5C15 at this time. Alert and in stable condition. IV site d/c'd. Instructions read to [patient and understanding verbalized. Left unit via wheelchair with all belongings at side.

## 2015-03-05 NOTE — Discharge Summary (Signed)
Physician Discharge Summary  Patient ID: William Gill MRN: [id number] DOB/AGE: 58-02-1956 58 y.o.  Admit date: 03/01/2015 Discharge date: 03/05/2015  Admission Diagnoses:Scoliosis lumbar spine, osteoarthritis with radiculopathy lumbar spine L3-S1, Spondylolisthesis lumbar spine  Discharge Diagnoses: Scoliosis lumbar spine, osteoarthritis with radiculopathy lumbar spine L3-S1, Spondylolisthesis lumbar spine Active Problems:   Scoliosis of lumbar spine   Discharged Condition: good  Hospital Course: William Gill was admitted and taken to the operating room for an uncomplicated lumbar decompression and fusion. Post op he has done remarkably well. He is voiding, ambulating, and tolerating a regular diet. His wound is clean, dry, and without signs of infection at discharge  Treatments: surgery: lumbar three-four,Lumbar four-five Lumbar five -sacral one posterior lumbar interbody fusion with interbody prothesis(Rise globus expandable cages five 8-92mmx 8mm, and 1 53mm cage right side L3/4), morselized local autograft Lumbar L3,4,and 5 laminectomies for decompression well beyond the exposure needed for a Plif ,posterior lateral arthrodesis L3-S1 with morselized local autograft  posterior segmental instrumentation L3-S1 with pedicle screws(nuvasive mas plif) Correction of scoliosis L3-S1   Discharge Exam: Blood pressure 110/51, pulse 77, temperature 99.2 F (37.3 C), temperature source Oral, resp. rate 20, height 6\' 2"  (1.88 m), weight 99.791 kg (220 lb), SpO2 100 %. General appearance: alert, cooperative, appears stated age and no distress Neurologic: Alert and oriented X 3, normal strength and tone. Normal symmetric reflexes. Normal coordination and gait  Disposition: 01-Home or Self Care lumbar spondylolisthesis    Medication List    ASK your doctor about these medications        amLODipine 10 MG tablet  Commonly known as:  NORVASC  Take 10 mg by mouth every morning.     atenolol 50 MG tablet  Commonly known as:  TENORMIN  Take 50 mg by mouth 2 (two) times daily.     atorvastatin 40 MG tablet  Commonly known as:  LIPITOR  Take 40 mg by mouth 2 (two) times daily.     Co Q 10 100 MG Caps  Take 100 mg by mouth daily.     cyclobenzaprine 10 MG tablet  Commonly known as:  FLEXERIL  Take 10 mg by mouth 3 (three) times daily with meals as needed for muscle spasms.     fenofibrate 145 MG tablet  Commonly known as:  TRICOR  Take 145 mg by mouth daily.     hydrochlorothiazide 25 MG tablet  Commonly known as:  HYDRODIURIL  Take 12.5 mg by mouth every morning.     HYDROcodone-acetaminophen 10-325 MG per tablet  Commonly known as:  NORCO  Take 1 tablet by mouth 3 (three) times daily.     lisinopril 10 MG tablet  Commonly known as:  PRINIVIL,ZESTRIL  Take 10 mg by mouth every evening.     multivitamin tablet  Take 1 tablet by mouth daily.     naproxen sodium 220 MG tablet  Commonly known as:  ANAPROX  Take 440 mg by mouth 2 (two) times daily as needed (pain).     omeprazole 40 MG capsule  Commonly known as:  PRILOSEC  Take 40 mg by mouth daily.           Follow-up Information    Follow up with Rhylen Shaheen L, MD In 4 weeks.   Specialty:  Neurosurgery   Why:  call office to make an appointment   Contact information:   [address] [place] [address] [phone]       Signed: Winfield Cunas  03/05/2015, 8:53 AM

## 2015-03-05 NOTE — Discharge Instructions (Signed)

## 2015-03-06 ENCOUNTER — Encounter (HOSPITAL_COMMUNITY): Payer: Self-pay | Admitting: Neurosurgery

## 2015-03-06 NOTE — H&P (Signed)
HOPI:                                                   [name] returns today.  He is a longtime patient of mine who I did a decompression on at L5-S1 in the distant past.  He has been undergoing epidural injections since 01/2013 with success; however, he returns today saying he is just in so much pain now, especially in the left lower extremity.  The pain is worse whenever he stands or walks.  He finds it hard to get his job done.              INTERVAL PFSH:                                  Medications:  Aleve, amlodipine, atenolol, atorvastatin, Celebrex, coQ10, fenofibrate, hydrochlorothiazide, hydrocodone, lisinopril, Neurontin and omeprazole.  No known drug allergies.                           PHYSICAL EXAMINATION:                    Vital signs:  Height 74 inches, weight 221 pounds, BMI is 28.37, blood pressure is 147/69, pulse 87, respiratory rate 16, temperature is 97.9.  He has 5/5 strength in the upper and lower extremities.  Markedly antalgic gait favoring the left lower extremity.  He has 2+ reflexes at the knees and ankles.  Left ankle is slightly less brisk than the right.  Normal muscle tone, bulk, coordination.  Memory, language, attention span and fund of knowledge are normal.   Speech is clear and fluent.  Symmetric facies, symmetric facial movement.  Hearing intact to voice.    [name] returns today so we can go over the MRI of the lumbar spine.  What it shows is that he is stenotic at L3-4 and at L4-5.  He has foraminal narrowing which is fairly severe at L4-5, especially on the left, and at L3-4 both on the left and right sides.  He has facet arthropathy, worse on the left than right, at 3-4 and I would say the same at 4-5.  He has some element of scoliosis and a rotatory aspect to it.  He is retrolisthesed at L3/L4. He has foraminal narrowing at L5-S1, which is more impressive on his saggital views.  I believe decompression would need to be from the L3 vertebral body to S1  to fully decompress both right and left sides.  Most of his pain is on the left, but he does have some right-sided pain.                             IMPRESSION/PLAN:                             He, I do not believe, would be able to return to work if he were to would undergo this operation, but I cannot make a 100% prediction about that.  He would like to proceed with this.  I gave him a detailed instruction sheet with regard to the operation.  We  will try to get this set up for early September.  We spoke in the office for 18 minutes face-to-face time.

## 2016-08-29 IMAGING — RF DG LUMBAR SPINE 2-3V
1 series · 2 of 2 positions shown · non-contrast
Comparison: None.

CLINICAL DATA: Back pain

EXAM:
DG C-ARM GT 120 MIN; LUMBAR SPINE - 2-3 VIEW
FLUOROSCOPY TIME:  If the device does not provide the exposure
index:
Fluoroscopy Time (in minutes and seconds):  4 minutes and 5 seconds.
Number of Acquired Images:  2

[Series 1: run · 2 of 2 slices shown]
[im 1/2]
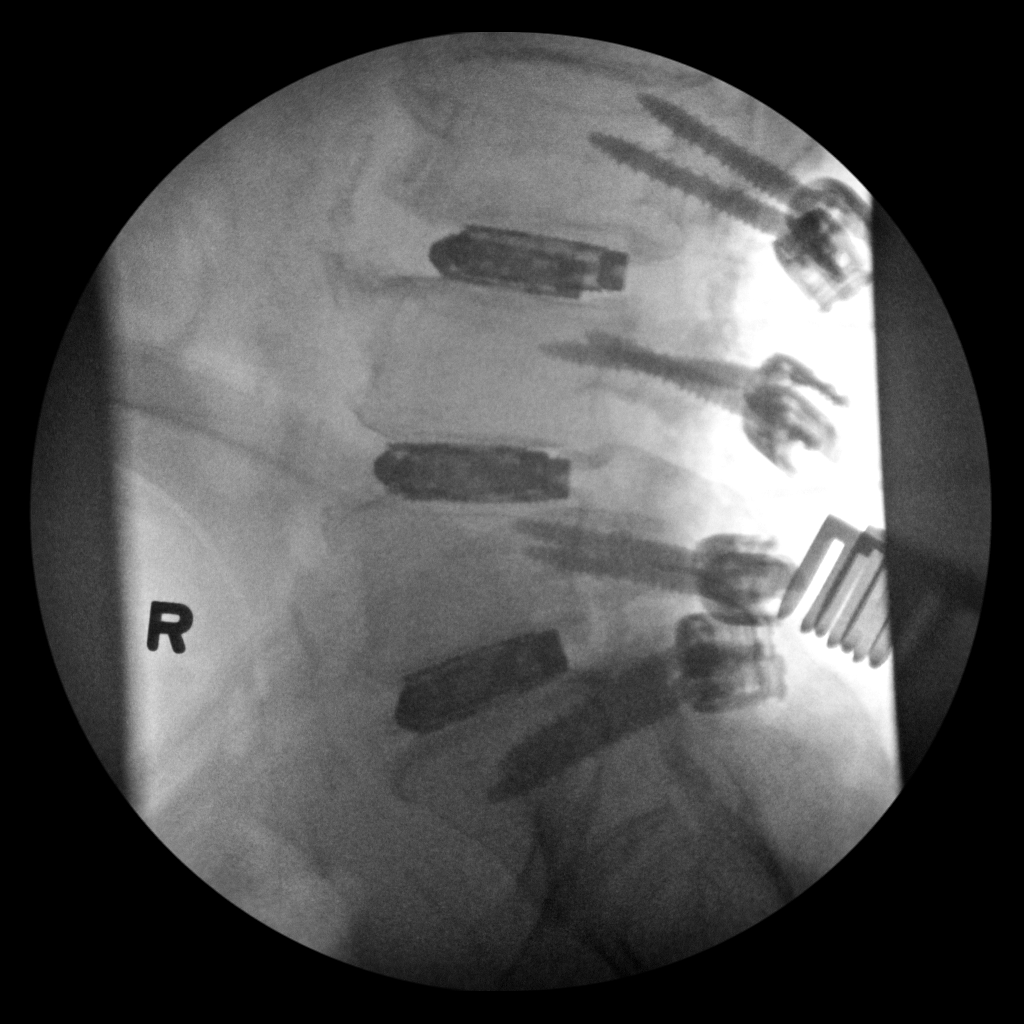
[im 2/2]
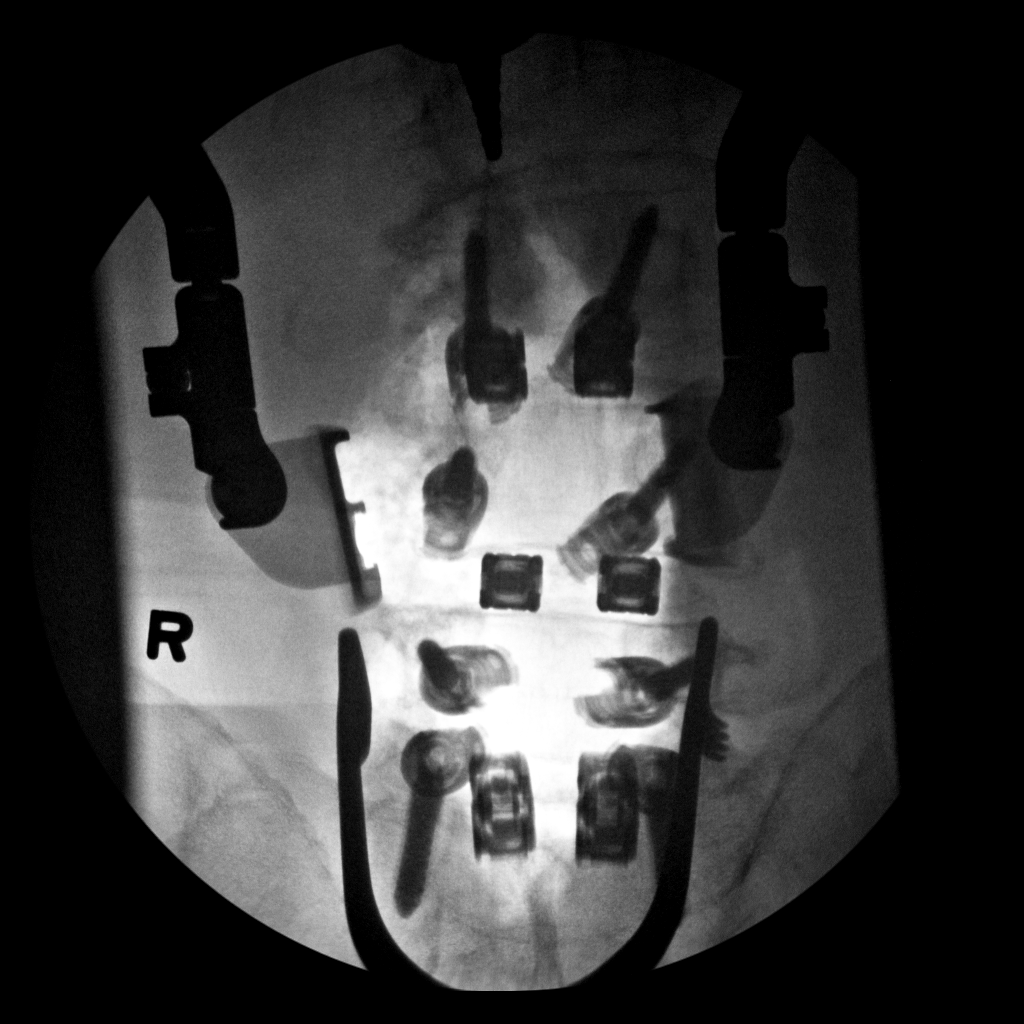

[2 of 2 positions shown; findings below may reference images not displayed]

FINDINGS: Pedicle screws and interbody cages have been placed in preparation
for L3 through S1 fusion.
IMPRESSION: As above.

## 2016-11-21 ENCOUNTER — Encounter (HOSPITAL_COMMUNITY): Payer: Self-pay | Admitting: Emergency Medicine

## 2016-11-21 ENCOUNTER — Emergency Department (HOSPITAL_COMMUNITY)
Admission: EM | Admit: 2016-11-21 | Discharge: 2016-11-21 | Disposition: A | Discharge status: Home Health | Payer: BLUE CROSS/BLUE SHIELD | Attending: Emergency Medicine | Admitting: Emergency Medicine

## 2016-11-21 ENCOUNTER — Emergency Department (HOSPITAL_COMMUNITY): Payer: BLUE CROSS/BLUE SHIELD

## 2016-11-21 ENCOUNTER — Ambulatory Visit (HOSPITAL_COMMUNITY): Payer: Self-pay

## 2016-11-21 DIAGNOSIS — Z79899 Other long term (current) drug therapy: Secondary | ICD-10-CM | POA: Diagnosis not present

## 2016-11-21 DIAGNOSIS — Z87891 Personal history of nicotine dependence: Secondary | ICD-10-CM | POA: Insufficient documentation

## 2016-11-21 DIAGNOSIS — S161XXA Strain of muscle, fascia and tendon at neck level, initial encounter: Secondary | ICD-10-CM

## 2016-11-21 DIAGNOSIS — M25511 Pain in right shoulder: Secondary | ICD-10-CM

## 2016-11-21 DIAGNOSIS — Y999 Unspecified external cause status: Secondary | ICD-10-CM | POA: Insufficient documentation

## 2016-11-21 DIAGNOSIS — Y9389 Activity, other specified: Secondary | ICD-10-CM | POA: Insufficient documentation

## 2016-11-21 DIAGNOSIS — Y92410 Unspecified street and highway as the place of occurrence of the external cause: Secondary | ICD-10-CM | POA: Diagnosis not present

## 2016-11-21 DIAGNOSIS — S39012A Strain of muscle, fascia and tendon of lower back, initial encounter: Secondary | ICD-10-CM | POA: Diagnosis not present

## 2016-11-21 DIAGNOSIS — I10 Essential (primary) hypertension: Secondary | ICD-10-CM | POA: Diagnosis not present

## 2016-11-21 DIAGNOSIS — S199XXA Unspecified injury of neck, initial encounter: Secondary | ICD-10-CM | POA: Diagnosis present

## 2016-11-21 MED ORDER — HYDROMORPHONE HCL 1 MG/ML IJ SOLN: 1.0000 mg | Freq: Once | INTRAMUSCULAR | Status: DC

## 2016-11-21 NOTE — Discharge Instructions (Signed)
Your vital signs within normal limits. Your oxygen level is 95% on room air. The x-ray of your neck, shoulder, and back show that your surgical hardware is in place. There no new fractures and no dislocations. Please expect to be sore over the next few days. Continue usual current medications for pain and spasm. Please see Dr.Fusco for additional evaluation and management if not improving.
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
## 2017-03-23 ENCOUNTER — Ambulatory Visit (HOSPITAL_COMMUNITY)
Admission: RE | Admit: 2017-03-23 | Discharge: 2017-03-23 | Disposition: A | Discharge status: Home / Self Care | Payer: BLUE CROSS/BLUE SHIELD | Source: Ambulatory Visit | Attending: Family Medicine | Admitting: Family Medicine

## 2017-03-23 ENCOUNTER — Other Ambulatory Visit (HOSPITAL_COMMUNITY): Payer: Self-pay | Admitting: Family Medicine

## 2017-03-23 DIAGNOSIS — E663 Overweight: Secondary | ICD-10-CM | POA: Diagnosis present

## 2017-03-23 DIAGNOSIS — R0781 Pleurodynia: Secondary | ICD-10-CM

## 2017-09-20 DIAGNOSIS — D649 Anemia, unspecified: Secondary | ICD-10-CM | POA: Diagnosis not present

## 2017-12-02 DIAGNOSIS — Z1389 Encounter for screening for other disorder: Secondary | ICD-10-CM | POA: Diagnosis not present

## 2017-12-02 DIAGNOSIS — M2550 Pain in unspecified joint: Secondary | ICD-10-CM | POA: Diagnosis not present

## 2017-12-02 DIAGNOSIS — E748 Other specified disorders of carbohydrate metabolism: Secondary | ICD-10-CM | POA: Diagnosis not present

## 2018-03-15 DIAGNOSIS — Z23 Encounter for immunization: Secondary | ICD-10-CM | POA: Diagnosis not present

## 2018-03-15 DIAGNOSIS — K219 Gastro-esophageal reflux disease without esophagitis: Secondary | ICD-10-CM | POA: Diagnosis not present

## 2018-03-15 DIAGNOSIS — Z1389 Encounter for screening for other disorder: Secondary | ICD-10-CM | POA: Diagnosis not present

## 2018-03-15 DIAGNOSIS — G894 Chronic pain syndrome: Secondary | ICD-10-CM | POA: Diagnosis not present

## 2018-03-15 DIAGNOSIS — M1991 Primary osteoarthritis, unspecified site: Secondary | ICD-10-CM | POA: Diagnosis not present

## 2018-05-11 ENCOUNTER — Telehealth: Payer: Self-pay

## 2018-05-11 NOTE — Telephone Encounter (Signed)
This patient has never been referred to our clinic??

## 2018-05-11 NOTE — Telephone Encounter (Signed)
[employer] called on behalf of the patient requesting a pre-auth for colonoscopy. Pls call [phone]
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
## 2018-07-05 DIAGNOSIS — E782 Mixed hyperlipidemia: Secondary | ICD-10-CM | POA: Diagnosis not present

## 2018-07-05 DIAGNOSIS — G894 Chronic pain syndrome: Secondary | ICD-10-CM | POA: Diagnosis not present

## 2018-07-05 DIAGNOSIS — M1991 Primary osteoarthritis, unspecified site: Secondary | ICD-10-CM | POA: Diagnosis not present

## 2018-07-05 DIAGNOSIS — Z1389 Encounter for screening for other disorder: Secondary | ICD-10-CM | POA: Diagnosis not present

## 2018-08-09 ENCOUNTER — Emergency Department (HOSPITAL_COMMUNITY)
Admission: EM | Admit: 2018-08-09 | Discharge: 2018-08-10 | Disposition: A | Discharge status: Home / Self Care | Payer: Medicare Other | Attending: Emergency Medicine | Admitting: Emergency Medicine

## 2018-08-09 ENCOUNTER — Encounter (HOSPITAL_COMMUNITY): Payer: Self-pay | Admitting: *Deleted

## 2018-08-09 ENCOUNTER — Other Ambulatory Visit: Payer: Self-pay

## 2018-08-09 DIAGNOSIS — M25511 Pain in right shoulder: Secondary | ICD-10-CM | POA: Diagnosis not present

## 2018-08-09 DIAGNOSIS — G8929 Other chronic pain: Secondary | ICD-10-CM | POA: Diagnosis not present

## 2018-08-09 DIAGNOSIS — M19011 Primary osteoarthritis, right shoulder: Secondary | ICD-10-CM | POA: Diagnosis not present

## 2018-08-09 DIAGNOSIS — Z79899 Other long term (current) drug therapy: Secondary | ICD-10-CM | POA: Insufficient documentation

## 2018-08-09 DIAGNOSIS — Z96611 Presence of right artificial shoulder joint: Secondary | ICD-10-CM | POA: Diagnosis not present

## 2018-08-09 DIAGNOSIS — M549 Dorsalgia, unspecified: Secondary | ICD-10-CM | POA: Diagnosis not present

## 2018-08-09 DIAGNOSIS — I10 Essential (primary) hypertension: Secondary | ICD-10-CM | POA: Diagnosis not present

## 2018-08-09 DIAGNOSIS — Z87891 Personal history of nicotine dependence: Secondary | ICD-10-CM | POA: Insufficient documentation

## 2018-08-09 MED ORDER — DEXAMETHASONE 4 MG PO TABS: 4.0000 mg | ORAL_TABLET | Freq: Two times a day (BID) | ORAL | 0 refills | Status: DC

## 2018-08-09 MED ORDER — KETOROLAC TROMETHAMINE 10 MG PO TABS
10.0000 mg | ORAL_TABLET | Freq: Once | ORAL | Status: AC
  Administered 2018-08-09: 10 mg via ORAL
  Filled 2018-08-09: qty 1

## 2018-08-09 MED ORDER — ONDANSETRON HCL 4 MG PO TABS
4.0000 mg | ORAL_TABLET | Freq: Once | ORAL | Status: AC
  Administered 2018-08-09: 4 mg via ORAL
  Filled 2018-08-09: qty 1

## 2018-08-09 MED ORDER — DICLOFENAC SODIUM 75 MG PO TBEC: 75.0000 mg | DELAYED_RELEASE_TABLET | Freq: Two times a day (BID) | ORAL | 0 refills | Status: DC

## 2018-08-09 MED ORDER — DEXAMETHASONE SODIUM PHOSPHATE 10 MG/ML IJ SOLN
10.0000 mg | Freq: Once | INTRAMUSCULAR | Status: AC
  Administered 2018-08-09: 10 mg via INTRAMUSCULAR
  Filled 2018-08-09: qty 1

## 2018-08-09 NOTE — Discharge Instructions (Signed)
Your vital signs are within normal limits.  Your shoulder is warm, but not hot.  No signs of an infected joint.  Your examination favors an inflammatory attack.  Review of your previous films show a arthritis in the area of your pain.  You have been given prescriptions for anti-inflammatory medication short course of steroid.  Please continue to use the narcotic medication previously ordered.  Please see Dr. Onnie Graham for additional evaluation if this is not improving.

## 2018-08-09 NOTE — ED Provider Notes (Signed)
[facility] EMERGENCY DEPARTMENT Provider Note   CSN: [id number] Arrival date & time: 08/09/18  2247    History   Chief Complaint Chief Complaint  Patient presents with  . Shoulder Pain    HPI William Gill is a 63 y.o. male.     Patient is a 63 year old male who presents to the emergency department with a complaint of right shoulder pain.  The patient states that he has been having pain in the right shoulder over the last couple of days.  He says the pain seems to be getting more intense.  He had total shoulder arthroplasty about 4 years ago.  He does not recall any recent falls or injury to the shoulder.  He has not had any fever or chills related to this.  He is not dropping objects.  He says when he uses his sling and keeps the shoulder and arm from hanging down it feels better.  No broken skin areas.  He presents to the emergency department for evaluation of this particular pain.  The history is provided by the patient.  Shoulder Pain  Associated symptoms: back pain   Associated symptoms: no neck pain     Past Medical History:  Diagnosis Date  . Arthritis   . Chronic back pain    DDD  . Complication of anesthesia    problems urinating after anesthesia  . GERD (gastroesophageal reflux disease)    takes Omeprazole daily  . History of bronchitis winter 2014  . Hyperlipidemia    takes Atorvastatin and Tricor daily  . Hypertension    takes Amlodipine,HCTZ,Atenolol,and Lisinopril daily  . Joint pain   . Numbness in left leg     Patient Active Problem List   Diagnosis Date Noted  . Scoliosis of lumbar spine 03/01/2015  . Arthritis of shoulder region, right, degenerative 02/22/2014  . Essential hypertension, benign 02/22/2014  . S/P shoulder replacement 02/22/2014  . Stiffness of joints, not elsewhere classified, multiple sites 02/25/2011  . Muscle weakness (generalized) 02/25/2011    Past Surgical History:  Procedure Laterality Date  . ANKLE SURGERY Left     . BACK SURGERY     microdiscectomy  . CARDIAC CATHETERIZATION  2011/2012  . CERVICAL FUSION    . COLONOSCOPY    . COLONOSCOPY N/A 01/04/2015   Procedure: COLONOSCOPY;  Surgeon: Aviva Signs, MD;  Location: [facility];  Service: Gastroenterology;  Laterality: N/A;  730  . SHOULDER ARTHROSCOPY Right   . TONSILLECTOMY    . TOTAL SHOULDER ARTHROPLASTY Right 02/22/2014   dr supple   . TOTAL SHOULDER ARTHROPLASTY Right 02/22/2014   Procedure: RIGHT TOTAL SHOULDER ARTHROPLASTY;  Surgeon: Marin Shutter, MD;  Location: [place];  Service: Orthopedics;  Laterality: Right;        Home Medications    Prior to Admission medications   Medication Sig Start Date End Date Taking? Authorizing Provider  amLODipine (NORVASC) 10 MG tablet Take 10 mg by mouth every morning.    [provider]  atenolol (TENORMIN) 50 MG tablet Take 50 mg by mouth 2 (two) times daily.    [provider]  atorvastatin (LIPITOR) 40 MG tablet Take 40 mg by mouth 2 (two) times daily.     [provider]  Coenzyme Q10 (CO Q 10) 100 MG CAPS Take 100 mg by mouth daily.    [provider]  cyclobenzaprine (FLEXERIL) 10 MG tablet Take 1 tablet (10 mg total) by mouth 3 (three) times daily as needed  for muscle spasms. 03/05/15   Ashok Pall, MD  fenofibrate (TRICOR) 145 MG tablet Take 145 mg by mouth daily.    [provider]  hydrochlorothiazide (HYDRODIURIL) 25 MG tablet Take 12.5 mg by mouth every morning.    [provider]  HYDROcodone-acetaminophen (NORCO) 10-325 MG per tablet Take 1 tablet by mouth 3 (three) times daily.    [provider]  lisinopril (PRINIVIL,ZESTRIL) 10 MG tablet Take 10 mg by mouth every evening.    [provider]  Multiple Vitamin (MULTIVITAMIN) tablet Take 1 tablet by mouth daily.    [provider]  naproxen sodium (ANAPROX) 220 MG tablet Take 440 mg by mouth 2 (two) times daily as needed (pain).     [provider]  omeprazole (PRILOSEC) 40 MG capsule Take 40 mg by mouth daily.    [provider]  oxyCODONE-acetaminophen (PERCOCET/ROXICET) 5-325 MG per tablet Take 1-2 tablets by mouth every 6 (six) hours as needed for moderate pain. 03/05/15   Ashok Pall, MD    Family History History reviewed. No pertinent family history.  Social History Social History   Tobacco Use  . Smoking status: Former Smoker    Packs/day: 1.00    Years: 20.00    Pack years: 20.00    Types: Cigarettes    Last attempt to quit: 02/15/2003    Years since quitting: 15.4  . Smokeless tobacco: Never Used  . Tobacco comment: quit smoking 45yrs ago  Substance Use Topics  . Alcohol use: Yes    Comment: occasionally beer  . Drug use: No     Allergies   Patient has no known allergies.   Review of Systems Review of Systems  Constitutional: Negative for activity change.       All ROS Neg except as noted in HPI  HENT: Negative for nosebleeds.   Eyes: Negative for photophobia and discharge.  Respiratory: Negative for cough, shortness of breath and wheezing.   Cardiovascular: Negative for chest pain and palpitations.  Gastrointestinal: Negative for abdominal pain and blood in stool.  Genitourinary: Negative for dysuria, frequency and hematuria.  Musculoskeletal: Positive for arthralgias and back pain. Negative for neck pain.       Shoulder pain  Skin: Negative.   Neurological: Negative for dizziness, seizures and speech difficulty.  Psychiatric/Behavioral: Negative for confusion and hallucinations.     Physical Exam Updated Vital Signs BP 138/64 (BP Location: Left Arm)   Pulse 88   Temp 98.1 F (36.7 C) (Oral)   Resp 16   Ht 6\' 2"  (1.88 m)   Wt 97.5 kg   SpO2 99%   BMI 27.60 kg/m   Physical Exam Vitals signs and nursing note reviewed.  Constitutional:      Appearance: He is well-developed. He is not toxic-appearing.  HENT:     Head: Normocephalic.     Right Ear: Tympanic membrane and  external ear normal.     Left Ear: Tympanic membrane and external ear normal.  Eyes:     General: Lids are normal.     Pupils: Pupils are equal, round, and reactive to light.  Neck:     Musculoskeletal: Normal range of motion and neck supple.     Vascular: No carotid bruit.  Cardiovascular:     Rate and Rhythm: Normal rate and regular rhythm.     Pulses: Normal pulses.     Heart sounds: Normal heart sounds.  Pulmonary:     Effort: No respiratory distress.  Breath sounds: Normal breath sounds.  Abdominal:     General: Bowel sounds are normal.     Palpations: Abdomen is soft.     Tenderness: There is no abdominal tenderness. There is no guarding.  Musculoskeletal: Normal range of motion.     Comments: Capillary refill is less than 2 seconds bilaterally.  The radial pulses are 2+ bilaterally.  There is full range of motion of right and left wrist.  Full range of motion of right and left elbow without problem.  There is no deformity of the humerus on the right.  No clavicle involvement.  The right shoulder is warm but not hot.  Lymphadenopathy:     Head:     Right side of head: No submandibular adenopathy.     Left side of head: No submandibular adenopathy.     Cervical: No cervical adenopathy.  Skin:    General: Skin is warm and dry.  Neurological:     Mental Status: He is alert and oriented to person, place, and time.     Cranial Nerves: No cranial nerve deficit.     Sensory: No sensory deficit.  Psychiatric:        Speech: Speech normal.      ED Treatments / Results  Labs (all labs ordered are listed, but only abnormal results are displayed) Labs Reviewed - No data to display  EKG None  Radiology No results found.  Procedures Procedures (including critical care time)  Medications Ordered in ED Medications - No data to display   Initial Impression / Assessment and Plan / ED Course  I have reviewed the triage vital signs and the nursing notes.  Pertinent  labs & imaging results that were available during my care of the patient were reviewed by me and considered in my medical decision making (see chart for details).          Final Clinical Impressions(s) / ED Diagnoses MDM Vital signs within normal limits.  Pulse oximetry is 99% on room air.  Within normal limits by my interpretation.  The patient presented with right shoulder pain.  No recent injury reported.  No fever or other issues to suggest possible infected joint.  It is of note that the patient had shoulder replacement about 4 years ago.  I have reviewed previous x-ray films and the patient has arthritis changes in the area where he says he has the most of his pain and discomfort.  The shoulder is warm, but not hot.  Examination favors inflammation attack.  The patient will be treated with anti-inflammatory medication and a short course of steroid.  The patient states he has some on narcotic pain medication from a previous event.  I have encouraged him to use all 3 of these medications and to keep the shoulder warm with a heating pad.  Patient also encouraged to continue to use a sling.  The patient is to follow-up with Dr. Augustin Coupe his orthopedic surgeon if this is not improving.  Patient is in agreement with this plan.   Final diagnoses:  Right shoulder pain, unspecified chronicity  Primary osteoarthritis of right shoulder    ED Discharge Orders         Ordered    diclofenac (VOLTAREN) 75 MG EC tablet  2 times daily     08/09/18 2340    dexamethasone (DECADRON) 4 MG tablet  2 times daily with meals     08/09/18 2340  Lily Kocher, PA-C 28/36/62 9476    Delora Fuel, MD 54/65/03 [phone]

## 2018-08-09 NOTE — ED Triage Notes (Signed)
Pt states he had shoulder surgery x 4 years ago and states when he woke up this am he is having severe pain to his right shoulder; pt denies any obvious injury; pt states he is able to move his arm but has pain with movement

## 2018-08-18 DIAGNOSIS — I10 Essential (primary) hypertension: Secondary | ICD-10-CM | POA: Diagnosis not present

## 2018-08-18 DIAGNOSIS — Z0001 Encounter for general adult medical examination with abnormal findings: Secondary | ICD-10-CM | POA: Diagnosis not present

## 2018-08-18 DIAGNOSIS — Z1389 Encounter for screening for other disorder: Secondary | ICD-10-CM | POA: Diagnosis not present

## 2018-08-18 DIAGNOSIS — K219 Gastro-esophageal reflux disease without esophagitis: Secondary | ICD-10-CM | POA: Diagnosis not present

## 2018-08-18 DIAGNOSIS — G894 Chronic pain syndrome: Secondary | ICD-10-CM | POA: Diagnosis not present

## 2018-08-18 DIAGNOSIS — E7849 Other hyperlipidemia: Secondary | ICD-10-CM | POA: Diagnosis not present

## 2018-08-18 DIAGNOSIS — K2971 Gastritis, unspecified, with bleeding: Secondary | ICD-10-CM | POA: Diagnosis not present

## 2018-08-30 DIAGNOSIS — D72829 Elevated white blood cell count, unspecified: Secondary | ICD-10-CM | POA: Diagnosis not present

## 2018-09-01 DIAGNOSIS — Z1211 Encounter for screening for malignant neoplasm of colon: Secondary | ICD-10-CM | POA: Diagnosis not present

## 2018-09-05 DIAGNOSIS — D649 Anemia, unspecified: Secondary | ICD-10-CM | POA: Diagnosis not present

## 2018-10-10 DIAGNOSIS — K219 Gastro-esophageal reflux disease without esophagitis: Secondary | ICD-10-CM | POA: Diagnosis not present

## 2018-10-10 DIAGNOSIS — M1991 Primary osteoarthritis, unspecified site: Secondary | ICD-10-CM | POA: Diagnosis not present

## 2018-10-10 DIAGNOSIS — I10 Essential (primary) hypertension: Secondary | ICD-10-CM | POA: Diagnosis not present

## 2018-10-10 DIAGNOSIS — G894 Chronic pain syndrome: Secondary | ICD-10-CM | POA: Diagnosis not present

## 2018-11-30 DIAGNOSIS — H40053 Ocular hypertension, bilateral: Secondary | ICD-10-CM | POA: Diagnosis not present

## 2018-12-13 DIAGNOSIS — K648 Other hemorrhoids: Secondary | ICD-10-CM | POA: Diagnosis not present

## 2018-12-13 DIAGNOSIS — G894 Chronic pain syndrome: Secondary | ICD-10-CM | POA: Diagnosis not present

## 2018-12-13 DIAGNOSIS — Z1389 Encounter for screening for other disorder: Secondary | ICD-10-CM | POA: Diagnosis not present

## 2018-12-13 DIAGNOSIS — M1991 Primary osteoarthritis, unspecified site: Secondary | ICD-10-CM | POA: Diagnosis not present

## 2019-01-24 DIAGNOSIS — I10 Essential (primary) hypertension: Secondary | ICD-10-CM | POA: Diagnosis not present

## 2019-01-24 DIAGNOSIS — K219 Gastro-esophageal reflux disease without esophagitis: Secondary | ICD-10-CM | POA: Diagnosis not present

## 2019-01-24 DIAGNOSIS — M2550 Pain in unspecified joint: Secondary | ICD-10-CM | POA: Diagnosis not present

## 2019-01-24 DIAGNOSIS — G894 Chronic pain syndrome: Secondary | ICD-10-CM | POA: Diagnosis not present

## 2019-02-06 DIAGNOSIS — H40023 Open angle with borderline findings, high risk, bilateral: Secondary | ICD-10-CM | POA: Diagnosis not present

## 2019-03-13 DIAGNOSIS — G894 Chronic pain syndrome: Secondary | ICD-10-CM | POA: Diagnosis not present

## 2019-03-13 DIAGNOSIS — K219 Gastro-esophageal reflux disease without esophagitis: Secondary | ICD-10-CM | POA: Diagnosis not present

## 2019-03-13 DIAGNOSIS — M1991 Primary osteoarthritis, unspecified site: Secondary | ICD-10-CM | POA: Diagnosis not present

## 2019-03-13 DIAGNOSIS — I10 Essential (primary) hypertension: Secondary | ICD-10-CM | POA: Diagnosis not present

## 2019-03-31 DIAGNOSIS — M25561 Pain in right knee: Secondary | ICD-10-CM | POA: Diagnosis not present

## 2019-04-22 DIAGNOSIS — E7849 Other hyperlipidemia: Secondary | ICD-10-CM | POA: Diagnosis not present

## 2019-04-22 DIAGNOSIS — M1991 Primary osteoarthritis, unspecified site: Secondary | ICD-10-CM | POA: Diagnosis not present

## 2019-04-22 DIAGNOSIS — I10 Essential (primary) hypertension: Secondary | ICD-10-CM | POA: Diagnosis not present

## 2019-04-24 DIAGNOSIS — G894 Chronic pain syndrome: Secondary | ICD-10-CM | POA: Diagnosis not present

## 2019-04-24 DIAGNOSIS — M2550 Pain in unspecified joint: Secondary | ICD-10-CM | POA: Diagnosis not present

## 2019-04-24 DIAGNOSIS — K219 Gastro-esophageal reflux disease without esophagitis: Secondary | ICD-10-CM | POA: Diagnosis not present

## 2019-04-24 DIAGNOSIS — I10 Essential (primary) hypertension: Secondary | ICD-10-CM | POA: Diagnosis not present

## 2019-05-31 DIAGNOSIS — G894 Chronic pain syndrome: Secondary | ICD-10-CM | POA: Diagnosis not present

## 2019-05-31 DIAGNOSIS — U071 COVID-19: Secondary | ICD-10-CM | POA: Diagnosis not present

## 2019-06-22 DIAGNOSIS — M1991 Primary osteoarthritis, unspecified site: Secondary | ICD-10-CM | POA: Diagnosis not present

## 2019-06-22 DIAGNOSIS — I10 Essential (primary) hypertension: Secondary | ICD-10-CM | POA: Diagnosis not present

## 2019-06-22 DIAGNOSIS — E7849 Other hyperlipidemia: Secondary | ICD-10-CM | POA: Diagnosis not present

## 2019-07-13 DIAGNOSIS — E7849 Other hyperlipidemia: Secondary | ICD-10-CM | POA: Diagnosis not present

## 2019-07-13 DIAGNOSIS — K219 Gastro-esophageal reflux disease without esophagitis: Secondary | ICD-10-CM | POA: Diagnosis not present

## 2019-07-13 DIAGNOSIS — Z23 Encounter for immunization: Secondary | ICD-10-CM | POA: Diagnosis not present

## 2019-07-13 DIAGNOSIS — I10 Essential (primary) hypertension: Secondary | ICD-10-CM | POA: Diagnosis not present

## 2019-07-13 DIAGNOSIS — G894 Chronic pain syndrome: Secondary | ICD-10-CM | POA: Diagnosis not present

## 2019-07-13 DIAGNOSIS — Z0000 Encounter for general adult medical examination without abnormal findings: Secondary | ICD-10-CM | POA: Diagnosis not present

## 2019-07-13 DIAGNOSIS — M1991 Primary osteoarthritis, unspecified site: Secondary | ICD-10-CM | POA: Diagnosis not present

## 2019-07-14 DIAGNOSIS — Z129 Encounter for screening for malignant neoplasm, site unspecified: Secondary | ICD-10-CM | POA: Diagnosis not present

## 2019-07-14 DIAGNOSIS — E782 Mixed hyperlipidemia: Secondary | ICD-10-CM | POA: Diagnosis not present

## 2019-07-14 DIAGNOSIS — E7849 Other hyperlipidemia: Secondary | ICD-10-CM | POA: Diagnosis not present

## 2019-07-14 DIAGNOSIS — Z1389 Encounter for screening for other disorder: Secondary | ICD-10-CM | POA: Diagnosis not present

## 2019-07-14 DIAGNOSIS — E039 Hypothyroidism, unspecified: Secondary | ICD-10-CM | POA: Diagnosis not present

## 2019-07-14 DIAGNOSIS — Z23 Encounter for immunization: Secondary | ICD-10-CM | POA: Diagnosis not present

## 2019-07-14 DIAGNOSIS — Z0000 Encounter for general adult medical examination without abnormal findings: Secondary | ICD-10-CM | POA: Diagnosis not present

## 2019-07-23 DIAGNOSIS — M1991 Primary osteoarthritis, unspecified site: Secondary | ICD-10-CM | POA: Diagnosis not present

## 2019-07-23 DIAGNOSIS — E7849 Other hyperlipidemia: Secondary | ICD-10-CM | POA: Diagnosis not present

## 2019-07-23 DIAGNOSIS — I10 Essential (primary) hypertension: Secondary | ICD-10-CM | POA: Diagnosis not present

## 2019-07-26 DIAGNOSIS — M25561 Pain in right knee: Secondary | ICD-10-CM | POA: Diagnosis not present

## 2019-07-27 DIAGNOSIS — H401131 Primary open-angle glaucoma, bilateral, mild stage: Secondary | ICD-10-CM | POA: Diagnosis not present

## 2019-08-09 DIAGNOSIS — I10 Essential (primary) hypertension: Secondary | ICD-10-CM | POA: Diagnosis not present

## 2019-08-09 DIAGNOSIS — M1991 Primary osteoarthritis, unspecified site: Secondary | ICD-10-CM | POA: Diagnosis not present

## 2019-08-09 DIAGNOSIS — R002 Palpitations: Secondary | ICD-10-CM | POA: Diagnosis not present

## 2019-08-09 DIAGNOSIS — K219 Gastro-esophageal reflux disease without esophagitis: Secondary | ICD-10-CM | POA: Diagnosis not present

## 2019-08-09 DIAGNOSIS — G894 Chronic pain syndrome: Secondary | ICD-10-CM | POA: Diagnosis not present

## 2019-08-20 DIAGNOSIS — I10 Essential (primary) hypertension: Secondary | ICD-10-CM | POA: Diagnosis not present

## 2019-08-20 DIAGNOSIS — E7849 Other hyperlipidemia: Secondary | ICD-10-CM | POA: Diagnosis not present

## 2019-08-20 DIAGNOSIS — M1991 Primary osteoarthritis, unspecified site: Secondary | ICD-10-CM | POA: Diagnosis not present

## 2019-08-30 DIAGNOSIS — M170 Bilateral primary osteoarthritis of knee: Secondary | ICD-10-CM | POA: Diagnosis not present

## 2019-08-30 DIAGNOSIS — M25561 Pain in right knee: Secondary | ICD-10-CM | POA: Diagnosis not present

## 2019-09-20 DIAGNOSIS — R002 Palpitations: Secondary | ICD-10-CM | POA: Diagnosis not present

## 2019-09-20 DIAGNOSIS — E7849 Other hyperlipidemia: Secondary | ICD-10-CM | POA: Diagnosis not present

## 2019-09-20 DIAGNOSIS — I10 Essential (primary) hypertension: Secondary | ICD-10-CM | POA: Diagnosis not present

## 2019-09-20 DIAGNOSIS — M1991 Primary osteoarthritis, unspecified site: Secondary | ICD-10-CM | POA: Diagnosis not present

## 2019-09-20 DIAGNOSIS — G894 Chronic pain syndrome: Secondary | ICD-10-CM | POA: Diagnosis not present

## 2019-10-04 DIAGNOSIS — M1711 Unilateral primary osteoarthritis, right knee: Secondary | ICD-10-CM | POA: Diagnosis not present

## 2019-10-11 DIAGNOSIS — M1711 Unilateral primary osteoarthritis, right knee: Secondary | ICD-10-CM | POA: Diagnosis not present

## 2019-10-12 DIAGNOSIS — M1991 Primary osteoarthritis, unspecified site: Secondary | ICD-10-CM | POA: Diagnosis not present

## 2019-10-12 DIAGNOSIS — K219 Gastro-esophageal reflux disease without esophagitis: Secondary | ICD-10-CM | POA: Diagnosis not present

## 2019-10-12 DIAGNOSIS — Z1389 Encounter for screening for other disorder: Secondary | ICD-10-CM | POA: Diagnosis not present

## 2019-10-12 DIAGNOSIS — G894 Chronic pain syndrome: Secondary | ICD-10-CM | POA: Diagnosis not present

## 2019-10-17 ENCOUNTER — Ambulatory Visit: Payer: Medicare Other | Admitting: Cardiology

## 2019-10-17 ENCOUNTER — Other Ambulatory Visit: Payer: Self-pay

## 2019-10-17 ENCOUNTER — Encounter: Payer: Self-pay | Admitting: Cardiology

## 2019-10-17 VITALS — BP 144/78 | HR 82 | Temp 97.3°F | Wt 217.0 lb

## 2019-10-17 DIAGNOSIS — R9431 Abnormal electrocardiogram [ECG] [EKG]: Secondary | ICD-10-CM | POA: Diagnosis not present

## 2019-10-17 DIAGNOSIS — I493 Ventricular premature depolarization: Secondary | ICD-10-CM | POA: Diagnosis not present

## 2019-10-17 DIAGNOSIS — R002 Palpitations: Secondary | ICD-10-CM

## 2019-10-17 NOTE — Patient Instructions (Signed)
Medication Instructions: Your physician recommends that you continue on your current medications as directed. Please refer to the Current Medication list given to you today.   Labwork: None today  Procedures/Testing: Your physician has requested that you have an echocardiogram. Echocardiography is a painless test that uses sound waves to create images of your heart. It provides your doctor with information about the size and shape of your heart and how well your heart's chambers and valves are working. This procedure takes approximately one hour. There are no restrictions for this procedure.  Your physician has recommended that you wear a holter monitor for 72 hours. Holter monitors are medical devices that record the heart's electrical activity. Doctors most often use these monitors to diagnose arrhythmias. Arrhythmias are problems with the speed or rhythm of the heartbeat. The monitor is a small, portable device. You can wear one while you do your normal daily activities. This is usually used to diagnose what is causing palpitations/syncope (passing out).    Follow-Up: We will call you with results  Any Additional Special Instructions Will Be Listed Below (If Applicable).     If you need a refill on your cardiac medications before your next appointment, please call your pharmacy.

## 2019-10-17 NOTE — Progress Notes (Signed)
Cardiology Office Note  Date: 10/17/2019   ID: Maguire, Quiring July 02, 1955, MRN [id number]  PCP:  Redmond School, MD  Cardiologist:  Rozann Lesches, MD Electrophysiologist:  None   Chief Complaint  Patient presents with  . Palpitations    History of Present Illness: William Gill is a 64 y.o. male referred for cardiology consultation by Dr. Gerarda Fraction for the evaluation of palpitations.  He states that approximately 6 months ago he started to notice a feeling of heart skips and a brief pause, initially associated with meals, and then more sporadically.  He has not had any exertional chest pain or breathlessness associated with this, no sudden syncope.  He was under a lot of stress when this initially started, but does not feel stressed at the present time.  He has cut back on caffeine somewhat without any specific change.  Symptoms are occurring every day.  Records indicate previous evaluation by [facility].  Cardiac catheterization performed in 2011 demonstrated mild, nonobstructive CAD.  He reports no definite history of heart attack or cardiac arrhythmia.  I personally reviewed his ECG today which shows sinus rhythm with possible old anteroseptal infarct pattern.  While listening to his heart I did note some PVCs which he correlated with the symptoms he has been feeling.  He states that he has been on atenolol for several years, prior symptoms of palpitations and rapid heartbeat.  Details are not clear.  Past Medical History:  Diagnosis Date  . Arthritis   . Chronic back pain   . DDD (degenerative disc disease), cervical   . GERD (gastroesophageal reflux disease)   . History of bronchitis winter 2014  . Hyperlipidemia   . Hypertension     Past Surgical History:  Procedure Laterality Date  . ANKLE SURGERY Left   . BACK SURGERY     microdiscectomy  . CARDIAC CATHETERIZATION    . CERVICAL FUSION    . COLONOSCOPY    . COLONOSCOPY N/A 01/04/2015   Procedure: COLONOSCOPY;   Surgeon: Aviva Signs, MD;  [facility];  Service: Gastroenterology;  Laterality: N/A;  730  . SHOULDER ARTHROSCOPY Right   . TONSILLECTOMY    . TOTAL SHOULDER ARTHROPLASTY Right 02/22/2014   dr supple   . TOTAL SHOULDER ARTHROPLASTY Right 02/22/2014   Procedure: RIGHT TOTAL SHOULDER ARTHROPLASTY;  Surgeon: Marin Shutter, MD;  Location: [place];  Service: Orthopedics;  Laterality: Right;    Current Outpatient Medications  Medication Sig Dispense Refill  . amLODipine (NORVASC) 10 MG tablet Take 10 mg by mouth every morning.    Marland Kitchen atenolol (TENORMIN) 50 MG tablet Take 50 mg by mouth 2 (two) times daily.    Marland Kitchen atorvastatin (LIPITOR) 80 MG tablet Take 80 mg by mouth daily.    . Coenzyme Q10 (CO Q 10) 100 MG CAPS Take 100 mg by mouth daily.    . cyclobenzaprine (FLEXERIL) 10 MG tablet Take 1 tablet (10 mg total) by mouth 3 (three) times daily as needed for muscle spasms. 60 tablet 1  . fenofibrate (TRICOR) 145 MG tablet Take 145 mg by mouth daily.    . hydrochlorothiazide (HYDRODIURIL) 25 MG tablet Take 12.5 mg by mouth every morning.    Marland Kitchen HYDROcodone-acetaminophen (NORCO) 10-325 MG per tablet Take 1 tablet by mouth 3 (three) times daily.    Marland Kitchen lisinopril (PRINIVIL,ZESTRIL) 10 MG tablet Take 10 mg by mouth every evening.    . Multiple Vitamin (MULTIVITAMIN) tablet Take 1 tablet by mouth daily.    Marland Kitchen  omeprazole (PRILOSEC) 40 MG capsule Take 40 mg by mouth daily.     No current facility-administered medications for this visit.   Allergies:  Patient has no known allergies.   ROS:   No syncope.  Physical Exam: VS:  BP (!) 144/78 (BP Location: Left Arm)   Pulse 82   Temp (!) 97.3 F (36.3 C)   Wt 217 lb (98.4 kg)   SpO2 97%   BMI 27.86 kg/m , BMI Body mass index is 27.86 kg/m.  Wt Readings from Last 3 Encounters:  10/17/19 217 lb (98.4 kg)  08/09/18 215 lb (97.5 kg)  03/01/15 220 lb (99.8 kg)    General: Patient appears comfortable at rest. HEENT: Conjunctiva and lids normal,  wearing a mask. Neck: Supple, no elevated JVP or carotid bruits, no thyromegaly. Lungs: Clear to auscultation, nonlabored breathing at rest. Cardiac: Regular rate and rhythm with occasional ectopy, no S3 or significant systolic murmur, no pericardial rub. Abdomen: Soft, nontender, bowel sounds present. Extremities: No pitting edema, distal pulses 2+. Skin: Warm and dry. Musculoskeletal: No kyphosis. Neuropsychiatric: Alert and oriented x3, affect grossly appropriate.  ECG:  An ECG dated 08/09/2019 was personally reviewed today and demonstrated:  Normal sinus rhythm with old anterior infarct pattern.  Recent Labwork:  January 2021: TSH 2.38  Other Studies Reviewed Today:  No recent cardiac testing for review today.  Assessment and Plan:  1.  Palpitations as described above, most likely PVCs.  This has been present over the last 6 months.  TSH was normal in January.  ECG shows sinus rhythm with poor R wave progression, rule out old anterior infarct pattern, however no previous history of infarct with reassuring cardiac catheterization 10 years ago.  He does not describe any angina symptoms and has had no sudden syncope.  Plan is to obtain an echocardiogram to evaluate cardiac structure and function and also a 72-hour cardiac monitor.  2.  Essential hypertension, currently on Norvasc, atenolol, HCTZ, and lisinopril.  Medication Adjustments/Labs and Tests Ordered: Current medicines are reviewed at length with the patient today.  Concerns regarding medicines are outlined above.   Tests Ordered: Orders Placed This Encounter  Procedures  . Holter monitor - 72 hour  . EKG 12-Lead  . ECHOCARDIOGRAM COMPLETE    Medication Changes: No orders of the defined types were placed in this encounter.   Disposition:  Follow up test results.  Signed, Satira Sark, MD, [facility] 10/17/2019 1:39 PM    [facility] at [facility] [address] [place], [place] [address]  Phone: [phone]; Fax: [phone]

## 2019-10-18 DIAGNOSIS — M1711 Unilateral primary osteoarthritis, right knee: Secondary | ICD-10-CM | POA: Diagnosis not present

## 2019-10-20 DIAGNOSIS — E7849 Other hyperlipidemia: Secondary | ICD-10-CM | POA: Diagnosis not present

## 2019-10-20 DIAGNOSIS — I10 Essential (primary) hypertension: Secondary | ICD-10-CM | POA: Diagnosis not present

## 2019-10-20 DIAGNOSIS — M1991 Primary osteoarthritis, unspecified site: Secondary | ICD-10-CM | POA: Diagnosis not present

## 2019-10-24 ENCOUNTER — Other Ambulatory Visit: Payer: Self-pay

## 2019-10-24 ENCOUNTER — Ambulatory Visit (HOSPITAL_COMMUNITY)
Admission: RE | Admit: 2019-10-24 | Discharge: 2019-10-24 | Disposition: A | Discharge status: Home / Self Care | Payer: Medicare Other | Source: Ambulatory Visit | Attending: Cardiology | Admitting: Cardiology

## 2019-10-24 DIAGNOSIS — R9431 Abnormal electrocardiogram [ECG] [EKG]: Secondary | ICD-10-CM | POA: Diagnosis not present

## 2019-10-24 NOTE — Progress Notes (Signed)
*  PRELIMINARY RESULTS* Echocardiogram 2D Echocardiogram has been performed.  William Gill 10/24/2019, 9:12 AM

## 2019-11-02 DIAGNOSIS — R002 Palpitations: Secondary | ICD-10-CM | POA: Diagnosis not present

## 2019-11-03 ENCOUNTER — Ambulatory Visit: Payer: Medicare Other

## 2019-11-03 ENCOUNTER — Other Ambulatory Visit: Payer: Self-pay

## 2019-11-03 ENCOUNTER — Telehealth: Payer: Self-pay

## 2019-11-03 DIAGNOSIS — R002 Palpitations: Secondary | ICD-10-CM

## 2019-11-03 DIAGNOSIS — I493 Ventricular premature depolarization: Secondary | ICD-10-CM

## 2019-11-03 MED ORDER — ATENOLOL 50 MG PO TABS: 75.0000 mg | ORAL_TABLET | Freq: Two times a day (BID) | ORAL | 6 refills | Status: DC

## 2019-11-03 NOTE — Telephone Encounter (Signed)
-----   Message from Satira Sark, MD sent at 11/03/2019  8:20 AM EDT ----- Results reviewed.  Cardiac monitor showed overall normal rhythm with rare PACs and PVCs which he could be feeling intermittently as palpitations.  He had 2 brief bursts of SVT which could also elicit a sense of palpitations, but importantly no sustained rhythm changes.  Recent echocardiogram was reassuring.  He is already on atenolol 50 mg twice daily.  Would continue atenolol, depending on frequency of palpitations this could be increased to 75 mg twice daily as a next step.  Otherwise no further cardiac testing is anticipated at this time.

## 2019-11-03 NOTE — Telephone Encounter (Signed)
Pt reports days when he has has bothersome palpitations and other days nothing.He would like to increase Atenolol to 75 mg bid and se if this helps.E-scribed to [facility]. He will call back and give me update.

## 2019-11-14 DIAGNOSIS — G894 Chronic pain syndrome: Secondary | ICD-10-CM | POA: Diagnosis not present

## 2019-11-14 DIAGNOSIS — R002 Palpitations: Secondary | ICD-10-CM | POA: Diagnosis not present

## 2019-11-20 DIAGNOSIS — M1991 Primary osteoarthritis, unspecified site: Secondary | ICD-10-CM | POA: Diagnosis not present

## 2019-11-20 DIAGNOSIS — E7849 Other hyperlipidemia: Secondary | ICD-10-CM | POA: Diagnosis not present

## 2019-11-20 DIAGNOSIS — I10 Essential (primary) hypertension: Secondary | ICD-10-CM | POA: Diagnosis not present

## 2019-12-07 DIAGNOSIS — M1711 Unilateral primary osteoarthritis, right knee: Secondary | ICD-10-CM | POA: Diagnosis not present

## 2019-12-14 DIAGNOSIS — M1991 Primary osteoarthritis, unspecified site: Secondary | ICD-10-CM | POA: Diagnosis not present

## 2019-12-14 DIAGNOSIS — K219 Gastro-esophageal reflux disease without esophagitis: Secondary | ICD-10-CM | POA: Diagnosis not present

## 2019-12-14 DIAGNOSIS — G894 Chronic pain syndrome: Secondary | ICD-10-CM | POA: Diagnosis not present

## 2019-12-14 DIAGNOSIS — I10 Essential (primary) hypertension: Secondary | ICD-10-CM | POA: Diagnosis not present

## 2020-01-02 DIAGNOSIS — H524 Presbyopia: Secondary | ICD-10-CM | POA: Diagnosis not present

## 2020-01-02 DIAGNOSIS — H35371 Puckering of macula, right eye: Secondary | ICD-10-CM | POA: Diagnosis not present

## 2020-01-19 DIAGNOSIS — I10 Essential (primary) hypertension: Secondary | ICD-10-CM | POA: Diagnosis not present

## 2020-01-19 DIAGNOSIS — E7849 Other hyperlipidemia: Secondary | ICD-10-CM | POA: Diagnosis not present

## 2020-01-19 DIAGNOSIS — M1991 Primary osteoarthritis, unspecified site: Secondary | ICD-10-CM | POA: Diagnosis not present

## 2020-01-22 DIAGNOSIS — H35371 Puckering of macula, right eye: Secondary | ICD-10-CM | POA: Diagnosis not present

## 2020-01-22 DIAGNOSIS — H3589 Other specified retinal disorders: Secondary | ICD-10-CM | POA: Diagnosis not present

## 2020-01-23 DIAGNOSIS — I10 Essential (primary) hypertension: Secondary | ICD-10-CM | POA: Diagnosis not present

## 2020-01-23 DIAGNOSIS — G894 Chronic pain syndrome: Secondary | ICD-10-CM | POA: Diagnosis not present

## 2020-01-23 DIAGNOSIS — K219 Gastro-esophageal reflux disease without esophagitis: Secondary | ICD-10-CM | POA: Diagnosis not present

## 2020-01-23 DIAGNOSIS — M1991 Primary osteoarthritis, unspecified site: Secondary | ICD-10-CM | POA: Diagnosis not present

## 2020-02-20 DIAGNOSIS — K219 Gastro-esophageal reflux disease without esophagitis: Secondary | ICD-10-CM | POA: Diagnosis not present

## 2020-02-20 DIAGNOSIS — M1991 Primary osteoarthritis, unspecified site: Secondary | ICD-10-CM | POA: Diagnosis not present

## 2020-02-20 DIAGNOSIS — E7849 Other hyperlipidemia: Secondary | ICD-10-CM | POA: Diagnosis not present

## 2020-02-20 DIAGNOSIS — I10 Essential (primary) hypertension: Secondary | ICD-10-CM | POA: Diagnosis not present

## 2020-03-06 DIAGNOSIS — G894 Chronic pain syndrome: Secondary | ICD-10-CM | POA: Diagnosis not present

## 2020-03-06 DIAGNOSIS — I10 Essential (primary) hypertension: Secondary | ICD-10-CM | POA: Diagnosis not present

## 2020-03-06 DIAGNOSIS — Z23 Encounter for immunization: Secondary | ICD-10-CM | POA: Diagnosis not present

## 2020-03-06 DIAGNOSIS — M1991 Primary osteoarthritis, unspecified site: Secondary | ICD-10-CM | POA: Diagnosis not present

## 2020-03-21 DIAGNOSIS — E7849 Other hyperlipidemia: Secondary | ICD-10-CM | POA: Diagnosis not present

## 2020-03-21 DIAGNOSIS — I10 Essential (primary) hypertension: Secondary | ICD-10-CM | POA: Diagnosis not present

## 2020-03-21 DIAGNOSIS — M1991 Primary osteoarthritis, unspecified site: Secondary | ICD-10-CM | POA: Diagnosis not present

## 2020-04-01 DIAGNOSIS — H401131 Primary open-angle glaucoma, bilateral, mild stage: Secondary | ICD-10-CM | POA: Diagnosis not present

## 2020-04-03 DIAGNOSIS — G894 Chronic pain syndrome: Secondary | ICD-10-CM | POA: Diagnosis not present

## 2020-04-03 DIAGNOSIS — K219 Gastro-esophageal reflux disease without esophagitis: Secondary | ICD-10-CM | POA: Diagnosis not present

## 2020-04-03 DIAGNOSIS — I10 Essential (primary) hypertension: Secondary | ICD-10-CM | POA: Diagnosis not present

## 2020-04-20 DIAGNOSIS — E7849 Other hyperlipidemia: Secondary | ICD-10-CM | POA: Diagnosis not present

## 2020-04-20 DIAGNOSIS — M1991 Primary osteoarthritis, unspecified site: Secondary | ICD-10-CM | POA: Diagnosis not present

## 2020-04-20 DIAGNOSIS — I10 Essential (primary) hypertension: Secondary | ICD-10-CM | POA: Diagnosis not present

## 2020-04-29 DIAGNOSIS — H401131 Primary open-angle glaucoma, bilateral, mild stage: Secondary | ICD-10-CM | POA: Diagnosis not present

## 2020-05-07 DIAGNOSIS — M545 Low back pain, unspecified: Secondary | ICD-10-CM | POA: Diagnosis not present

## 2020-05-07 DIAGNOSIS — G894 Chronic pain syndrome: Secondary | ICD-10-CM | POA: Diagnosis not present

## 2020-05-07 DIAGNOSIS — I10 Essential (primary) hypertension: Secondary | ICD-10-CM | POA: Diagnosis not present

## 2020-06-11 DIAGNOSIS — K219 Gastro-esophageal reflux disease without esophagitis: Secondary | ICD-10-CM | POA: Diagnosis not present

## 2020-06-11 DIAGNOSIS — G894 Chronic pain syndrome: Secondary | ICD-10-CM | POA: Diagnosis not present

## 2020-06-11 DIAGNOSIS — M1991 Primary osteoarthritis, unspecified site: Secondary | ICD-10-CM | POA: Diagnosis not present

## 2020-06-21 DIAGNOSIS — M1991 Primary osteoarthritis, unspecified site: Secondary | ICD-10-CM | POA: Diagnosis not present

## 2020-06-21 DIAGNOSIS — E7849 Other hyperlipidemia: Secondary | ICD-10-CM | POA: Diagnosis not present

## 2020-06-21 DIAGNOSIS — I10 Essential (primary) hypertension: Secondary | ICD-10-CM | POA: Diagnosis not present

## 2020-07-12 DIAGNOSIS — I10 Essential (primary) hypertension: Secondary | ICD-10-CM | POA: Diagnosis not present

## 2020-07-12 DIAGNOSIS — M1991 Primary osteoarthritis, unspecified site: Secondary | ICD-10-CM | POA: Diagnosis not present

## 2020-07-12 DIAGNOSIS — Z0001 Encounter for general adult medical examination with abnormal findings: Secondary | ICD-10-CM | POA: Diagnosis not present

## 2020-07-12 DIAGNOSIS — E7489 Other specified disorders of carbohydrate metabolism: Secondary | ICD-10-CM | POA: Diagnosis not present

## 2020-07-12 DIAGNOSIS — G894 Chronic pain syndrome: Secondary | ICD-10-CM | POA: Diagnosis not present

## 2020-07-12 DIAGNOSIS — E039 Hypothyroidism, unspecified: Secondary | ICD-10-CM | POA: Diagnosis not present

## 2020-07-12 DIAGNOSIS — E7849 Other hyperlipidemia: Secondary | ICD-10-CM | POA: Diagnosis not present

## 2020-07-12 DIAGNOSIS — Z1389 Encounter for screening for other disorder: Secondary | ICD-10-CM | POA: Diagnosis not present

## 2020-07-12 DIAGNOSIS — K219 Gastro-esophageal reflux disease without esophagitis: Secondary | ICD-10-CM | POA: Diagnosis not present

## 2020-07-23 DIAGNOSIS — E7489 Other specified disorders of carbohydrate metabolism: Secondary | ICD-10-CM | POA: Diagnosis not present

## 2020-07-23 DIAGNOSIS — R7309 Other abnormal glucose: Secondary | ICD-10-CM | POA: Diagnosis not present

## 2020-07-24 ENCOUNTER — Other Ambulatory Visit: Payer: Self-pay | Admitting: Internal Medicine

## 2020-07-24 ENCOUNTER — Other Ambulatory Visit (HOSPITAL_COMMUNITY): Payer: Self-pay | Admitting: Internal Medicine

## 2020-07-24 DIAGNOSIS — K219 Gastro-esophageal reflux disease without esophagitis: Secondary | ICD-10-CM

## 2020-07-30 ENCOUNTER — Ambulatory Visit (HOSPITAL_COMMUNITY)
Admission: RE | Admit: 2020-07-30 | Discharge: 2020-07-30 | Disposition: A | Discharge status: Home / Self Care | Payer: Medicare Other | Source: Ambulatory Visit | Attending: Internal Medicine | Admitting: Internal Medicine

## 2020-07-30 ENCOUNTER — Other Ambulatory Visit: Payer: Self-pay

## 2020-07-30 DIAGNOSIS — K219 Gastro-esophageal reflux disease without esophagitis: Secondary | ICD-10-CM | POA: Insufficient documentation

## 2020-08-01 DIAGNOSIS — H401112 Primary open-angle glaucoma, right eye, moderate stage: Secondary | ICD-10-CM | POA: Diagnosis not present

## 2020-08-12 ENCOUNTER — Encounter: Payer: Self-pay | Admitting: Internal Medicine

## 2020-08-14 DIAGNOSIS — K219 Gastro-esophageal reflux disease without esophagitis: Secondary | ICD-10-CM | POA: Diagnosis not present

## 2020-08-14 DIAGNOSIS — G894 Chronic pain syndrome: Secondary | ICD-10-CM | POA: Diagnosis not present

## 2020-08-14 DIAGNOSIS — M1991 Primary osteoarthritis, unspecified site: Secondary | ICD-10-CM | POA: Diagnosis not present

## 2020-08-14 DIAGNOSIS — I10 Essential (primary) hypertension: Secondary | ICD-10-CM | POA: Diagnosis not present

## 2020-08-19 DIAGNOSIS — M1991 Primary osteoarthritis, unspecified site: Secondary | ICD-10-CM | POA: Diagnosis not present

## 2020-08-19 DIAGNOSIS — E7849 Other hyperlipidemia: Secondary | ICD-10-CM | POA: Diagnosis not present

## 2020-08-19 DIAGNOSIS — I10 Essential (primary) hypertension: Secondary | ICD-10-CM | POA: Diagnosis not present

## 2020-09-03 DIAGNOSIS — H2513 Age-related nuclear cataract, bilateral: Secondary | ICD-10-CM | POA: Diagnosis not present

## 2020-09-03 DIAGNOSIS — H25013 Cortical age-related cataract, bilateral: Secondary | ICD-10-CM | POA: Diagnosis not present

## 2020-09-03 DIAGNOSIS — H35373 Puckering of macula, bilateral: Secondary | ICD-10-CM | POA: Diagnosis not present

## 2020-09-03 DIAGNOSIS — H401131 Primary open-angle glaucoma, bilateral, mild stage: Secondary | ICD-10-CM | POA: Diagnosis not present

## 2020-09-03 DIAGNOSIS — H2511 Age-related nuclear cataract, right eye: Secondary | ICD-10-CM | POA: Diagnosis not present

## 2020-09-11 DIAGNOSIS — K219 Gastro-esophageal reflux disease without esophagitis: Secondary | ICD-10-CM | POA: Diagnosis not present

## 2020-09-11 DIAGNOSIS — K5900 Constipation, unspecified: Secondary | ICD-10-CM | POA: Diagnosis not present

## 2020-09-11 DIAGNOSIS — T50905A Adverse effect of unspecified drugs, medicaments and biological substances, initial encounter: Secondary | ICD-10-CM | POA: Diagnosis not present

## 2020-09-11 DIAGNOSIS — I10 Essential (primary) hypertension: Secondary | ICD-10-CM | POA: Diagnosis not present

## 2020-09-11 DIAGNOSIS — G894 Chronic pain syndrome: Secondary | ICD-10-CM | POA: Diagnosis not present

## 2020-09-12 ENCOUNTER — Other Ambulatory Visit: Payer: Self-pay

## 2020-09-12 ENCOUNTER — Encounter: Payer: Self-pay | Admitting: *Deleted

## 2020-09-12 ENCOUNTER — Telehealth: Payer: Self-pay | Admitting: *Deleted

## 2020-09-12 ENCOUNTER — Ambulatory Visit (INDEPENDENT_AMBULATORY_CARE_PROVIDER_SITE_OTHER): Payer: Medicare Other | Admitting: Internal Medicine

## 2020-09-12 ENCOUNTER — Encounter: Payer: Self-pay | Admitting: Internal Medicine

## 2020-09-12 VITALS — BP 123/65 | HR 75 | Temp 97.1°F | Ht 74.0 in | Wt 218.2 lb

## 2020-09-12 DIAGNOSIS — K449 Diaphragmatic hernia without obstruction or gangrene: Secondary | ICD-10-CM | POA: Diagnosis not present

## 2020-09-12 DIAGNOSIS — K219 Gastro-esophageal reflux disease without esophagitis: Secondary | ICD-10-CM | POA: Diagnosis not present

## 2020-09-12 NOTE — H&P (View-Only) (Signed)
Referring Provider: Redmond School, MD Primary Care Physician:  Redmond School, MD Primary GI:  Dr. Abbey Chatters  Chief Complaint  Patient presents with  . Gastroesophageal Reflux    Trouble with reflux. Taking nexium 40mg  BID x 2 months and it has helped a lot. Was on pantoprazole prior    HPI:   William Gill is a 65 y.o. male who presents to the clinic today by referral from his PCP Dr. Gerarda Fraction for evaluation.  Patient states he has had reflux for years but recently this has gotten worse.  He actually notes waking up in the middle of the night with aspiration event.  He was previously on omeprazole twice daily but was switched to Nexium by Dr. Gerarda Fraction and states his symptoms are vastly improved.  Also using 3 pillows at night to prop himself up.  No dysphagia odynophagia.  Did have a swallowing study which showed severe reflux in the supine position as well as small hiatal hernia.  Past Medical History:  Diagnosis Date  . Arthritis   . Chronic back pain   . DDD (degenerative disc disease), cervical   . GERD (gastroesophageal reflux disease)   . History of bronchitis winter 2014  . Hyperlipidemia   . Hypertension     Past Surgical History:  Procedure Laterality Date  . ANKLE SURGERY Left   . BACK SURGERY     microdiscectomy  . CARDIAC CATHETERIZATION    . CERVICAL FUSION    . COLONOSCOPY    . COLONOSCOPY N/A 01/04/2015   Procedure: COLONOSCOPY;  Surgeon: Aviva Signs, MD;  [facility];  Service: Gastroenterology;  Laterality: N/A;  730  . SHOULDER ARTHROSCOPY Right   . TONSILLECTOMY    . TOTAL SHOULDER ARTHROPLASTY Right 02/22/2014   dr supple   . TOTAL SHOULDER ARTHROPLASTY Right 02/22/2014   Procedure: RIGHT TOTAL SHOULDER ARTHROPLASTY;  Surgeon: Marin Shutter, MD;  Location: [place];  Service: Orthopedics;  Laterality: Right;    Current Outpatient Medications  Medication Sig Dispense Refill  . amLODipine (NORVASC) 10 MG tablet Take 10 mg by mouth every morning.     Marland Kitchen atenolol (TENORMIN) 50 MG tablet Take 1.5 tablets (75 mg total) by mouth 2 (two) times daily. 90 tablet 6  . atorvastatin (LIPITOR) 80 MG tablet Take 80 mg by mouth daily.    . Coenzyme Q10 (CO Q 10) 100 MG CAPS Take 100 mg by mouth daily.    . cyclobenzaprine (FLEXERIL) 10 MG tablet Take 1 tablet (10 mg total) by mouth 3 (three) times daily as needed for muscle spasms. 60 tablet 1  . esomeprazole (NEXIUM) 40 MG capsule Take 40 mg by mouth 2 (two) times daily before a meal.    . ezetimibe (ZETIA) 10 MG tablet Take 10 mg by mouth daily.    . fenofibrate (TRICOR) 145 MG tablet Take 145 mg by mouth daily.    Marland Kitchen HYDROcodone-acetaminophen (NORCO) 10-325 MG per tablet Take 1 tablet by mouth 3 (three) times daily. As needed    . lisinopril (PRINIVIL,ZESTRIL) 10 MG tablet Take 10 mg by mouth every evening.    . Multiple Vitamin (MULTIVITAMIN) tablet Take 1 tablet by mouth daily.    Marland Kitchen ROCKLATAN 0.02-0.005 % SOLN Apply to eye. 1 drop each eye at night     No current facility-administered medications for this visit.    Allergies as of 09/12/2020  . (No Known Allergies)    Family History  Problem Relation Age of Onset  .  Lung cancer Mother   . Hypertension Father   . Prostate cancer Father   . Hypertension Sister   . Hypertension Brother   . Hypertension Sister   . Hypertension Sister   . Hypertension Sister   . Hypertension Brother   . Hypertension Brother     Social History   Socioeconomic History  . Marital status: Married    Spouse name: Not on file  . Number of children: Not on file  . Years of education: Not on file  . Highest education level: Not on file  Occupational History  . Not on file  Tobacco Use  . Smoking status: Former Smoker    Packs/day: 1.00    Years: 20.00    Pack years: 20.00    Types: Cigarettes    Quit date: 02/15/2003    Years since quitting: 17.5  . Smokeless tobacco: Never Used  . Tobacco comment: quit smoking 25yrs ago  Vaping Use  . Vaping  Use: Never used  Substance and Sexual Activity  . Alcohol use: Yes    Comment: occasionally beer  . Drug use: No  . Sexual activity: Not on file  Other Topics Concern  . Not on file  Social History Narrative  . Not on file   Social Determinants of Health   Financial Resource Strain: Not on file  Food Insecurity: Not on file  Transportation Needs: Not on file  Physical Activity: Not on file  Stress: Not on file  Social Connections: Not on file    Subjective: Review of Systems  Constitutional: Negative for chills and fever.  HENT: Negative for congestion and hearing loss.   Eyes: Negative for blurred vision and double vision.  Respiratory: Negative for cough and shortness of breath.   Cardiovascular: Negative for chest pain and palpitations.  Gastrointestinal: Negative for abdominal pain, blood in stool, constipation, diarrhea, heartburn, melena and vomiting.  Genitourinary: Negative for dysuria and urgency.  Musculoskeletal: Negative for joint pain and myalgias.  Skin: Negative for itching and rash.  Neurological: Negative for dizziness and headaches.  Psychiatric/Behavioral: Negative for depression. The patient is not nervous/anxious.      Objective: BP 123/65   Pulse 75   Temp (!) 97.1 F (36.2 C)   Ht 6\' 2"  (1.88 m)   Wt 218 lb 3.2 oz (99 kg)   BMI 28.02 kg/m  Physical Exam Constitutional:      Appearance: Normal appearance.  HENT:     Head: Normocephalic and atraumatic.  Eyes:     Extraocular Movements: Extraocular movements intact.     Conjunctiva/sclera: Conjunctivae normal.  Cardiovascular:     Rate and Rhythm: Normal rate and regular rhythm.  Pulmonary:     Effort: Pulmonary effort is normal.     Breath sounds: Normal breath sounds.  Abdominal:     General: Bowel sounds are normal.     Palpations: Abdomen is soft.  Musculoskeletal:        General: Normal range of motion.     Cervical back: Normal range of motion and neck supple.  Skin:     General: Skin is warm.  Neurological:     General: No focal deficit present.     Mental Status: He is alert and oriented to person, place, and time.  Psychiatric:        Mood and Affect: Mood normal.        Behavior: Behavior normal.      Assessment: *Chronic GERD-better controlled on Nexium twice daily *Hiatal  hernia *Barrett's screening   Plan: Patient's GERD is better controlled on Nexium twice daily.  We will continue.  Discussed PPIs in depth with patient today.  Answered all questions.  Given his age, race, reflux for greater than 5 years, I would recommend a one-time EGD for Barrett's esophagus screening.  Discussed in depth with patient today and he is agreeable.  The risks including infection, bleed, or perforation as well as benefits, limitations, alternatives and imponderables have been reviewed with the patient. Potential for esophageal dilation, biopsy, etc. have also been reviewed.  Questions have been answered. All parties agreeable.  Thank you Dr. Gerarda Fraction for the kind referral  09/12/2020 2:59 PM   Disclaimer: This note was dictated with voice recognition software. Similar sounding words can inadvertently be transcribed and may not be corrected upon review.

## 2020-09-12 NOTE — Telephone Encounter (Signed)
PA approved via [facility]. Auth# [id number] DOS 09/30/20-05/01/21

## 2020-09-12 NOTE — Patient Instructions (Signed)
We will schedule you for EGD to further evaluate your reflux as well as to screen for Barrett's esophagus.  Continue on Nexium twice daily.  Further recommendations to follow.  Lifestyle and home remedies TO MANAGE REFLUX/HEARTBURN    You may eliminate or reduce the frequency of heartburn by making the following lifestyle changes:    Control your weight. Being overweight is a major risk factor for heartburn and GERD. Excess pounds put pressure on your abdomen, pushing up your stomach and causing acid to back up into your esophagus.     Eat smaller meals. 4 TO 6 MEALS A DAY. This reduces pressure on the lower esophageal sphincter, helping to prevent the valve from opening and acid from washing back into your esophagus.      Loosen your belt. Clothes that fit tightly around your waist put pressure on your abdomen and the lower esophageal sphincter.      Eliminate heartburn triggers. Everyone has specific triggers. Common triggers such as fatty or fried foods, spicy food, tomato sauce, carbonated beverages, alcohol, chocolate, mint, garlic, onion, caffeine and nicotine may make heartburn worse.     Avoid stooping or bending. Tying your shoes is OK. Bending over for longer periods to weed your garden isn't, especially soon after eating.     Don't lie down after a meal. Wait at least three to four hours after eating before going to bed, and don't lie down right after eating.   At [facility] we value your feedback. You may receive a survey about your visit today. Please share your experience as we strive to create trusting relationships with our patients to provide genuine, compassionate, quality care.  We appreciate your understanding and patience as we review any laboratory studies, imaging, and other diagnostic tests that are ordered as we care for you. Our office policy is 5 business days for review of these results, and any emergent or urgent results are addressed in a  timely manner for your best interest. If you do not hear from our office in 1 week, please contact us.   We also encourage the use of MyChart, which contains your medical information for your review as well. If you are not enrolled in this feature, an access code is on this after visit summary for your convenience. Thank you for allowing [place] to be involved in your care.  It was great to see you today!  I hope you have a great rest of your spring!!    Elon Alas. Abbey Chatters, D.O. Gastroenterology and Hepatology [facility]

## 2020-09-12 NOTE — Progress Notes (Signed)
Referring Provider: Redmond School, MD Primary Care Physician:  Redmond School, MD Primary GI:  Dr. Abbey Chatters  Chief Complaint  Patient presents with  . Gastroesophageal Reflux    Trouble with reflux. Taking nexium 40mg  BID x 2 months and it has helped a lot. Was on pantoprazole prior    HPI:   William Gill is a 65 y.o. male who presents to the clinic today by referral from his PCP Dr. Gerarda Fraction for evaluation.  Patient states he has had reflux for years but recently this has gotten worse.  He actually notes waking up in the middle of the night with aspiration event.  He was previously on omeprazole twice daily but was switched to Nexium by Dr. Gerarda Fraction and states his symptoms are vastly improved.  Also using 3 pillows at night to prop himself up.  No dysphagia odynophagia.  Did have a swallowing study which showed severe reflux in the supine position as well as small hiatal hernia.  Past Medical History:  Diagnosis Date  . Arthritis   . Chronic back pain   . DDD (degenerative disc disease), cervical   . GERD (gastroesophageal reflux disease)   . History of bronchitis winter 2014  . Hyperlipidemia   . Hypertension     Past Surgical History:  Procedure Laterality Date  . ANKLE SURGERY Left   . BACK SURGERY     microdiscectomy  . CARDIAC CATHETERIZATION    . CERVICAL FUSION    . COLONOSCOPY    . COLONOSCOPY N/A 01/04/2015   Procedure: COLONOSCOPY;  Surgeon: Aviva Signs, MD;  [facility];  Service: Gastroenterology;  Laterality: N/A;  730  . SHOULDER ARTHROSCOPY Right   . TONSILLECTOMY    . TOTAL SHOULDER ARTHROPLASTY Right 02/22/2014   dr supple   . TOTAL SHOULDER ARTHROPLASTY Right 02/22/2014   Procedure: RIGHT TOTAL SHOULDER ARTHROPLASTY;  Surgeon: Marin Shutter, MD;  Location: [place];  Service: Orthopedics;  Laterality: Right;    Current Outpatient Medications  Medication Sig Dispense Refill  . amLODipine (NORVASC) 10 MG tablet Take 10 mg by mouth every morning.     Marland Kitchen atenolol (TENORMIN) 50 MG tablet Take 1.5 tablets (75 mg total) by mouth 2 (two) times daily. 90 tablet 6  . atorvastatin (LIPITOR) 80 MG tablet Take 80 mg by mouth daily.    . Coenzyme Q10 (CO Q 10) 100 MG CAPS Take 100 mg by mouth daily.    . cyclobenzaprine (FLEXERIL) 10 MG tablet Take 1 tablet (10 mg total) by mouth 3 (three) times daily as needed for muscle spasms. 60 tablet 1  . esomeprazole (NEXIUM) 40 MG capsule Take 40 mg by mouth 2 (two) times daily before a meal.    . ezetimibe (ZETIA) 10 MG tablet Take 10 mg by mouth daily.    . fenofibrate (TRICOR) 145 MG tablet Take 145 mg by mouth daily.    Marland Kitchen HYDROcodone-acetaminophen (NORCO) 10-325 MG per tablet Take 1 tablet by mouth 3 (three) times daily. As needed    . lisinopril (PRINIVIL,ZESTRIL) 10 MG tablet Take 10 mg by mouth every evening.    . Multiple Vitamin (MULTIVITAMIN) tablet Take 1 tablet by mouth daily.    Marland Kitchen ROCKLATAN 0.02-0.005 % SOLN Apply to eye. 1 drop each eye at night     No current facility-administered medications for this visit.    Allergies as of 09/12/2020  . (No Known Allergies)    Family History  Problem Relation Age of Onset  .  Lung cancer Mother   . Hypertension Father   . Prostate cancer Father   . Hypertension Sister   . Hypertension Brother   . Hypertension Sister   . Hypertension Sister   . Hypertension Sister   . Hypertension Brother   . Hypertension Brother     Social History   Socioeconomic History  . Marital status: Married    Spouse name: Not on file  . Number of children: Not on file  . Years of education: Not on file  . Highest education level: Not on file  Occupational History  . Not on file  Tobacco Use  . Smoking status: Former Smoker    Packs/day: 1.00    Years: 20.00    Pack years: 20.00    Types: Cigarettes    Quit date: 02/15/2003    Years since quitting: 17.5  . Smokeless tobacco: Never Used  . Tobacco comment: quit smoking 65yrs ago  Vaping Use  . Vaping  Use: Never used  Substance and Sexual Activity  . Alcohol use: Yes    Comment: occasionally beer  . Drug use: No  . Sexual activity: Not on file  Other Topics Concern  . Not on file  Social History Narrative  . Not on file   Social Determinants of Health   Financial Resource Strain: Not on file  Food Insecurity: Not on file  Transportation Needs: Not on file  Physical Activity: Not on file  Stress: Not on file  Social Connections: Not on file    Subjective: Review of Systems  Constitutional: Negative for chills and fever.  HENT: Negative for congestion and hearing loss.   Eyes: Negative for blurred vision and double vision.  Respiratory: Negative for cough and shortness of breath.   Cardiovascular: Negative for chest pain and palpitations.  Gastrointestinal: Negative for abdominal pain, blood in stool, constipation, diarrhea, heartburn, melena and vomiting.  Genitourinary: Negative for dysuria and urgency.  Musculoskeletal: Negative for joint pain and myalgias.  Skin: Negative for itching and rash.  Neurological: Negative for dizziness and headaches.  Psychiatric/Behavioral: Negative for depression. The patient is not nervous/anxious.      Objective: BP 123/65   Pulse 75   Temp (!) 97.1 F (36.2 C)   Ht 6\' 2"  (1.88 m)   Wt 218 lb 3.2 oz (99 kg)   BMI 28.02 kg/m  Physical Exam Constitutional:      Appearance: Normal appearance.  HENT:     Head: Normocephalic and atraumatic.  Eyes:     Extraocular Movements: Extraocular movements intact.     Conjunctiva/sclera: Conjunctivae normal.  Cardiovascular:     Rate and Rhythm: Normal rate and regular rhythm.  Pulmonary:     Effort: Pulmonary effort is normal.     Breath sounds: Normal breath sounds.  Abdominal:     General: Bowel sounds are normal.     Palpations: Abdomen is soft.  Musculoskeletal:        General: Normal range of motion.     Cervical back: Normal range of motion and neck supple.  Skin:     General: Skin is warm.  Neurological:     General: No focal deficit present.     Mental Status: He is alert and oriented to person, place, and time.  Psychiatric:        Mood and Affect: Mood normal.        Behavior: Behavior normal.      Assessment: *Chronic GERD-better controlled on Nexium twice daily *Hiatal  hernia *Barrett's screening   Plan: Patient's GERD is better controlled on Nexium twice daily.  We will continue.  Discussed PPIs in depth with patient today.  Answered all questions.  Given his age, race, reflux for greater than 5 years, I would recommend a one-time EGD for Barrett's esophagus screening.  Discussed in depth with patient today and he is agreeable.  The risks including infection, bleed, or perforation as well as benefits, limitations, alternatives and imponderables have been reviewed with the patient. Potential for esophageal dilation, biopsy, etc. have also been reviewed.  Questions have been answered. All parties agreeable.  Thank you Dr. Gerarda Fraction for the kind referral  09/12/2020 2:59 PM   Disclaimer: This note was dictated with voice recognition software. Similar sounding words can inadvertently be transcribed and may not be corrected upon review.

## 2020-09-18 DIAGNOSIS — I10 Essential (primary) hypertension: Secondary | ICD-10-CM | POA: Diagnosis not present

## 2020-09-18 DIAGNOSIS — E7849 Other hyperlipidemia: Secondary | ICD-10-CM | POA: Diagnosis not present

## 2020-09-24 ENCOUNTER — Encounter (HOSPITAL_COMMUNITY): Payer: Self-pay

## 2020-09-27 ENCOUNTER — Other Ambulatory Visit (HOSPITAL_COMMUNITY)
Admission: RE | Admit: 2020-09-27 | Discharge: 2020-09-27 | Disposition: A | Discharge status: Home / Self Care | Payer: Medicare Other | Source: Ambulatory Visit | Attending: Internal Medicine | Admitting: Internal Medicine

## 2020-09-27 ENCOUNTER — Other Ambulatory Visit: Payer: Self-pay

## 2020-09-27 DIAGNOSIS — Z01812 Encounter for preprocedural laboratory examination: Secondary | ICD-10-CM | POA: Insufficient documentation

## 2020-09-27 DIAGNOSIS — Z20822 Contact with and (suspected) exposure to covid-19: Secondary | ICD-10-CM | POA: Diagnosis not present

## 2020-09-27 LAB — SARS CORONAVIRUS 2 (TAT 6-24 HRS): SARS Coronavirus 2: NEGATIVE

## 2020-09-27 LAB — BASIC METABOLIC PANEL
Anion gap: 9 (ref 5–15)
BUN: 16 mg/dL (ref 8–23)
CO2: 25 mmol/L (ref 22–32)
Calcium: 9.7 mg/dL (ref 8.9–10.3)
Chloride: 102 mmol/L (ref 98–111)
Creatinine, Ser: 1.22 mg/dL (ref 0.61–1.24)
GFR, Estimated: >60 mL/min (ref >60)
Glucose, Bld: 116 mg/dL — ABNORMAL HIGH (ref 70–99)
Potassium: 4.2 mmol/L (ref 3.5–5.1)
Sodium: 136 mmol/L (ref 135–145)

## 2020-09-30 ENCOUNTER — Other Ambulatory Visit: Payer: Self-pay

## 2020-09-30 ENCOUNTER — Encounter (HOSPITAL_COMMUNITY): Payer: Self-pay

## 2020-09-30 ENCOUNTER — Ambulatory Visit (HOSPITAL_COMMUNITY): Payer: Medicare Other | Admitting: Anesthesiology

## 2020-09-30 ENCOUNTER — Encounter (HOSPITAL_COMMUNITY)
Admission: RE | Disposition: A | Discharge status: Home / Self Care | Payer: Self-pay | Source: Ambulatory Visit | Attending: Internal Medicine

## 2020-09-30 ENCOUNTER — Ambulatory Visit (HOSPITAL_COMMUNITY)
Admission: RE | Admit: 2020-09-30 | Discharge: 2020-09-30 | Disposition: A | Discharge status: Home / Self Care | Payer: Medicare Other | Source: Ambulatory Visit | Attending: Internal Medicine | Admitting: Internal Medicine

## 2020-09-30 DIAGNOSIS — Z87891 Personal history of nicotine dependence: Secondary | ICD-10-CM | POA: Insufficient documentation

## 2020-09-30 DIAGNOSIS — Z79899 Other long term (current) drug therapy: Secondary | ICD-10-CM | POA: Diagnosis not present

## 2020-09-30 DIAGNOSIS — Z801 Family history of malignant neoplasm of trachea, bronchus and lung: Secondary | ICD-10-CM | POA: Insufficient documentation

## 2020-09-30 DIAGNOSIS — K219 Gastro-esophageal reflux disease without esophagitis: Secondary | ICD-10-CM | POA: Insufficient documentation

## 2020-09-30 DIAGNOSIS — K208 Other esophagitis without bleeding: Secondary | ICD-10-CM | POA: Diagnosis not present

## 2020-09-30 DIAGNOSIS — K449 Diaphragmatic hernia without obstruction or gangrene: Secondary | ICD-10-CM | POA: Insufficient documentation

## 2020-09-30 DIAGNOSIS — Z8042 Family history of malignant neoplasm of prostate: Secondary | ICD-10-CM | POA: Insufficient documentation

## 2020-09-30 DIAGNOSIS — K3189 Other diseases of stomach and duodenum: Secondary | ICD-10-CM | POA: Diagnosis not present

## 2020-09-30 DIAGNOSIS — I10 Essential (primary) hypertension: Secondary | ICD-10-CM | POA: Insufficient documentation

## 2020-09-30 DIAGNOSIS — Z8249 Family history of ischemic heart disease and other diseases of the circulatory system: Secondary | ICD-10-CM | POA: Diagnosis not present

## 2020-09-30 DIAGNOSIS — K2289 Other specified disease of esophagus: Secondary | ICD-10-CM | POA: Insufficient documentation

## 2020-09-30 DIAGNOSIS — K2950 Unspecified chronic gastritis without bleeding: Secondary | ICD-10-CM | POA: Insufficient documentation

## 2020-09-30 HISTORY — PX: ESOPHAGOGASTRODUODENOSCOPY (EGD) WITH PROPOFOL: SHX5813

## 2020-09-30 HISTORY — PX: BIOPSY: SHX5522

## 2020-09-30 SURGERY — ESOPHAGOGASTRODUODENOSCOPY (EGD) WITH PROPOFOL
Anesthesia: General

## 2020-09-30 MED ORDER — PROPOFOL 10 MG/ML IV BOLUS
INTRAVENOUS | Status: DC | PRN
  Administered 2020-09-30: 50 mg via INTRAVENOUS
  Administered 2020-09-30: 100 mg via INTRAVENOUS

## 2020-09-30 MED ORDER — LIDOCAINE HCL (CARDIAC) PF 100 MG/5ML IV SOSY
PREFILLED_SYRINGE | INTRAVENOUS | Status: DC | PRN
  Administered 2020-09-30: 50 mg via INTRAVENOUS

## 2020-09-30 MED ORDER — LACTATED RINGERS IV SOLN: INTRAVENOUS | Status: DC

## 2020-09-30 NOTE — Interval H&P Note (Signed)
History and Physical Interval Note:  09/30/2020 9:42 AM  William Gill  has presented today for surgery, with the diagnosis of gerd.  The various methods of treatment have been discussed with the patient and family. After consideration of risks, benefits and other options for treatment, the patient has consented to  Procedure(s) with comments: ESOPHAGOGASTRODUODENOSCOPY (EGD) WITH PROPOFOL (N/A) - am appt as a surgical intervention.  The patient's history has been reviewed, patient examined, no change in status, stable for surgery.  I have reviewed the patient's chart and labs.  Questions were answered to the patient's satisfaction.     Eloise Harman

## 2020-09-30 NOTE — Anesthesia Preprocedure Evaluation (Addendum)
Anesthesia Evaluation  Patient identified by MRN, date of birth, ID band Patient awake    Reviewed: Allergy & Precautions, NPO status , Patient's Chart, lab work & pertinent test results, reviewed documented beta blocker date and time   History of Anesthesia Complications Negative for: history of anesthetic complications  Airway Mallampati: III  TM Distance: >3 FB Neck ROM: Full    Dental  (+) Dental Advisory Given, Caps   Pulmonary former smoker,    Pulmonary exam normal breath sounds clear to auscultation       Cardiovascular Exercise Tolerance: Good hypertension, Pt. on medications and Pt. on home beta blockers Normal cardiovascular exam Rhythm:Regular Rate:Normal     Neuro/Psych negative psych ROS   GI/Hepatic Neg liver ROS, GERD  Medicated and Controlled,  Endo/Other  negative endocrine ROS  Renal/GU negative Renal ROS     Musculoskeletal  (+) Arthritis ,   Abdominal   Peds  Hematology negative hematology ROS (+)   Anesthesia Other Findings   Reproductive/Obstetrics negative OB ROS                            Anesthesia Physical Anesthesia Plan  ASA: II  Anesthesia Plan: General   Post-op Pain Management:    Induction: Intravenous  PONV Risk Score and Plan: Propofol infusion  Airway Management Planned: Nasal Cannula and Natural Airway  Additional Equipment:   Intra-op Plan:   Post-operative Plan:   Informed Consent: I have reviewed the patients History and Physical, chart, labs and discussed the procedure including the risks, benefits and alternatives for the proposed anesthesia with the patient or authorized representative who has indicated his/her understanding and acceptance.     Dental advisory given  Plan Discussed with: CRNA and Surgeon  Anesthesia Plan Comments:         Anesthesia Quick Evaluation

## 2020-09-30 NOTE — Anesthesia Postprocedure Evaluation (Signed)
Anesthesia Post Note  Patient: William Gill  Procedure(s) Performed: ESOPHAGOGASTRODUODENOSCOPY (EGD) WITH PROPOFOL (N/A ) BIOPSY  Patient location during evaluation: Endoscopy Anesthesia Type: General Level of consciousness: awake and alert and oriented Pain management: pain level controlled Vital Signs Assessment: post-procedure vital signs reviewed and stable Respiratory status: spontaneous breathing and respiratory function stable Cardiovascular status: blood pressure returned to baseline and stable Postop Assessment: no apparent nausea or vomiting Anesthetic complications: no   No complications documented.   Last Vitals:  Vitals:   09/30/20 0828 09/30/20 1001  BP: 133/65 (!) 97/45  Resp: 13 14  Temp: 36.8 C 36.7 C  SpO2: 99% 100%    Last Pain:  Vitals:   09/30/20 1001  TempSrc: Oral  PainSc: 0-No pain                 Zahli Vetsch C Naven Giambalvo

## 2020-09-30 NOTE — Op Note (Signed)
[facility] Patient Name: William Gill Procedure Date: 09/30/2020 9:43 AM MRN: [id number] Date of Birth: 05/27/1956 Attending MD: Elon Alas. Abbey Chatters DO CSN: [id number] Age: 65 Admit Type: Outpatient Procedure:                Upper GI endoscopy Indications:              Screening for Barrett's esophagus in patient at                            risk for this condition Providers:                Elon Alas. Abbey Chatters, DO, Gwenlyn Fudge, RN, Dereck Leep, Technician Referring MD:              Medicines:                See the Anesthesia note for documentation of the                            administered medications Complications:            No immediate complications. Estimated Blood Loss:     Estimated blood loss was minimal. Procedure:                Pre-Anesthesia Assessment:                           - The anesthesia plan was to use monitored                            anesthesia care (MAC).                           After obtaining informed consent, the endoscope was                            passed under direct vision. Throughout the                            procedure, the patient's blood pressure, pulse, and                            oxygen saturations were monitored continuously. The                            [id number]-H190 ([id number]) scope was introduced through the                            mouth, and advanced to the second part of duodenum.                            The upper GI endoscopy was accomplished without                            difficulty.  The patient tolerated the procedure                            well. Scope In: 9:53:48 AM Scope Out: 9:57:19 AM Total Procedure Duration: 0 hours 3 minutes 31 seconds  Findings:      The Z-line was irregular and was found 39 cm from the incisors. Biopsies       were taken with a cold forceps for histology.      Localized moderately erythematous mucosa without bleeding was found in        the gastric antrum. Biopsies were taken with a cold forceps for       Helicobacter pylori testing.      The duodenal bulb, first portion of the duodenum and second portion of       the duodenum were normal.      A small hiatal hernia was present. Impression:               - Z-line irregular, 39 cm from the incisors.                            Biopsied.                           - Erythematous mucosa in the antrum. Biopsied.                           - Normal duodenal bulb, first portion of the                            duodenum and second portion of the duodenum.                           - Small hiatal hernia. Moderate Sedation:      Per Anesthesia Care Recommendation:           - Patient has a contact number available for                            emergencies. The signs and symptoms of potential                            delayed complications were discussed with the                            patient. Return to normal activities tomorrow.                            Written discharge instructions were provided to the                            patient.                           - Resume previous diet.                           - Continue present medications.                           -  Await pathology results.                           - Repeat upper endoscopy for surveillance based on                            pathology results.                           - Return to GI clinic PRN. Procedure Code(s):        --- Professional ---                           [phone], Esophagogastroduodenoscopy, flexible,                            transoral; with biopsy, single or multiple Diagnosis Code(s):        --- Professional ---                           K22.8, Other specified diseases of esophagus                           K31.89, Other diseases of stomach and duodenum                           K44.9, Diaphragmatic hernia without obstruction or                            gangrene                            Z13.810, Encounter for screening for upper                            gastrointestinal disorder CPT copyright 2019 [employer]. All rights reserved. The codes documented in this report are preliminary and upon coder review may  be revised to meet current compliance requirements. Elon Alas. Abbey Chatters, DO [name], DO 09/30/2020 10:01:48 AM This report has been signed electronically. Number of Addenda: 0

## 2020-09-30 NOTE — Discharge Instructions (Addendum)
EGD Discharge instructions Please read the instructions outlined below and refer to this sheet in the next few weeks. These discharge instructions provide you with general information on caring for yourself after you leave the hospital. Your doctor may also give you specific instructions. While your treatment has been planned according to the most current medical practices available, unavoidable complications occasionally occur. If you have any problems or questions after discharge, please call your doctor. ACTIVITY  You may resume your regular activity but move at a slower pace for the next 24 hours.   Take frequent rest periods for the next 24 hours.   Walking will help expel (get rid of) the air and reduce the bloated feeling in your abdomen.   No driving for 24 hours (because of the anesthesia (medicine) used during the test).   You may shower.   Do not sign any important legal documents or operate any machinery for 24 hours (because of the anesthesia used during the test).  NUTRITION  Drink plenty of fluids.   You may resume your normal diet.   Begin with a light meal and progress to your normal diet.   Avoid alcoholic beverages for 24 hours or as instructed by your caregiver.  MEDICATIONS  You may resume your normal medications unless your caregiver tells you otherwise.  WHAT YOU CAN EXPECT TODAY  You may experience abdominal discomfort such as a feeling of fullness or "gas" pains.  FOLLOW-UP  Your doctor will discuss the results of your test with you.  SEEK IMMEDIATE MEDICAL ATTENTION IF ANY OF THE FOLLOWING OCCUR:  Excessive nausea (feeling sick to your stomach) and/or vomiting.   Severe abdominal pain and distention (swelling).   Trouble swallowing.   Temperature over 101 F (37.8 C).   Rectal bleeding or vomiting of blood.    Your EGD revealed a mild amount of inflammation in your stomach.  I took biopsies of this to rule infection with bacteria called H.  pylori.  Also said biopsies of your esophagus to rule out Barrett's esophagus.  Await pathology results, my office will contact you.  Continue on Nexium twice daily for 8 weeks then decrease to once daily thereafter if your symptoms tolerate.  Further recommendations to follow after biopsies reviewed.  I hope you have a great rest of your week!   Gastritis, Adult  Gastritis is swelling (inflammation) of the stomach. Gastritis can develop quickly (acute). It can also develop slowly over time (chronic). It is important to get help for this condition. If you do not get help, your stomach can bleed, and you can get sores (ulcers) in your stomach. What are the causes? This condition may be caused by:  Germs that get to your stomach.  Drinking too much alcohol.  Medicines you are taking.  Too much acid in the stomach.  A disease of the intestines or stomach.  Stress.  An allergic reaction.  Crohn's disease.  Some cancer treatments (radiation). Sometimes the cause of this condition is not known. What are the signs or symptoms? Symptoms of this condition include:  Pain in your stomach.  A burning feeling in your stomach.  Feeling sick to your stomach (nauseous).  Throwing up (vomiting).  Feeling too full after you eat.  Weight loss.  Bad breath.  Throwing up blood.  Blood in your poop (stool). How is this diagnosed? This condition may be diagnosed with:  Your medical history and symptoms.  A physical exam.  Tests. These can include: ? Blood  tests. ? Stool tests. ? A procedure to look inside your stomach (upper endoscopy). ? A test in which a sample of tissue is taken for testing (biopsy). How is this treated? Treatment for this condition depends on what caused it. You may be given:  Antibiotic medicine, if your condition was caused by germs.  H2 blockers and similar medicines, if your condition was caused by too much acid. Follow these instructions at  home: Medicines  Take over-the-counter and prescription medicines only as told by your doctor.  If you were prescribed an antibiotic medicine, take it as told by your doctor. Do not stop taking it even if you start to feel better. Eating and drinking  Eat small meals often, instead of large meals.  Avoid foods and drinks that make your symptoms worse.  Drink enough fluid to keep your pee (urine) pale yellow.   Alcohol use  Do not drink alcohol if: ? Your doctor tells you not to drink. ? You are pregnant, may be pregnant, or are planning to become pregnant.  If you drink alcohol: ? Limit your use to:  0-1 drink a day for women.  0-2 drinks a day for men. ? Be aware of how much alcohol is in your drink. In the [place] one drink equals one 12 oz bottle of beer (355 mL), one 5 oz glass of wine (148 mL), or one 1 oz glass of hard liquor (44 mL). General instructions  Talk with your doctor about ways to manage stress. You can exercise or do deep breathing, meditation, or yoga.  Do not smoke or use products that have nicotine or tobacco. If you need help quitting, ask your doctor.  Keep all follow-up visits as told by your doctor. This is important. Contact a doctor if:  Your symptoms get worse.  Your symptoms go away and then come back. Get help right away if:  You throw up blood or something that looks like coffee grounds.  You have black or dark red poop.  You throw up any time you try to drink fluids.  Your stomach pain gets worse.  You have a fever.  You do not feel better after one week. Summary  Gastritis is swelling (inflammation) of the stomach.  You must get help for this condition. If you do not get help, your stomach can bleed, and you can get sores (ulcers).  This condition is diagnosed with medical history, physical exam, or tests.  You can be treated with medicines for germs or medicines to block too much acid in your stomach. This information is not  intended to replace advice given to you by your health care provider. Make sure you discuss any questions you have with your health care provider. Document Revised: 10/26/2017 Document Reviewed: 10/26/2017 Elsevier Patient Education  2021 Antelope K. Abbey Chatters, D.O. [facility] '

## 2020-09-30 NOTE — Transfer of Care (Signed)
Immediate Anesthesia Transfer of Care Note  Patient: William Gill  Procedure(s) Performed: ESOPHAGOGASTRODUODENOSCOPY (EGD) WITH PROPOFOL (N/A ) BIOPSY  Patient Location: PACU  Anesthesia Type:General  Level of Consciousness: awake and alert   Airway & Oxygen Therapy: Patient Spontanous Breathing  Post-op Assessment: Report given to RN and Post -op Vital signs reviewed and stable  Post vital signs: Reviewed and stable  Last Vitals:  Vitals Value Taken Time  BP    Temp    Pulse    Resp    SpO2      Last Pain:  Vitals:   09/30/20 0951  TempSrc:   PainSc: 0-No pain      Patients Stated Pain Goal: 5 (54/09/81 1914)  Complications: No complications documented.

## 2020-10-01 LAB — SURGICAL PATHOLOGY

## 2020-10-02 DIAGNOSIS — H25811 Combined forms of age-related cataract, right eye: Secondary | ICD-10-CM | POA: Diagnosis not present

## 2020-10-02 DIAGNOSIS — H2511 Age-related nuclear cataract, right eye: Secondary | ICD-10-CM | POA: Diagnosis not present

## 2020-10-02 DIAGNOSIS — H401111 Primary open-angle glaucoma, right eye, mild stage: Secondary | ICD-10-CM | POA: Diagnosis not present

## 2020-10-03 ENCOUNTER — Encounter (HOSPITAL_COMMUNITY): Payer: Self-pay | Admitting: Internal Medicine

## 2020-10-07 DIAGNOSIS — H2512 Age-related nuclear cataract, left eye: Secondary | ICD-10-CM | POA: Diagnosis not present

## 2020-10-07 DIAGNOSIS — H25012 Cortical age-related cataract, left eye: Secondary | ICD-10-CM | POA: Diagnosis not present

## 2020-10-15 DIAGNOSIS — G894 Chronic pain syndrome: Secondary | ICD-10-CM | POA: Diagnosis not present

## 2020-10-16 DIAGNOSIS — H401121 Primary open-angle glaucoma, left eye, mild stage: Secondary | ICD-10-CM | POA: Diagnosis not present

## 2020-10-16 DIAGNOSIS — H25812 Combined forms of age-related cataract, left eye: Secondary | ICD-10-CM | POA: Diagnosis not present

## 2020-10-16 DIAGNOSIS — H2512 Age-related nuclear cataract, left eye: Secondary | ICD-10-CM | POA: Diagnosis not present

## 2020-10-16 DIAGNOSIS — H25012 Cortical age-related cataract, left eye: Secondary | ICD-10-CM | POA: Diagnosis not present

## 2020-10-19 DIAGNOSIS — E7849 Other hyperlipidemia: Secondary | ICD-10-CM | POA: Diagnosis not present

## 2020-10-19 DIAGNOSIS — I10 Essential (primary) hypertension: Secondary | ICD-10-CM | POA: Diagnosis not present

## 2020-11-13 DIAGNOSIS — M1991 Primary osteoarthritis, unspecified site: Secondary | ICD-10-CM | POA: Diagnosis not present

## 2020-11-13 DIAGNOSIS — G894 Chronic pain syndrome: Secondary | ICD-10-CM | POA: Diagnosis not present

## 2020-11-13 DIAGNOSIS — K219 Gastro-esophageal reflux disease without esophagitis: Secondary | ICD-10-CM | POA: Diagnosis not present

## 2020-11-13 DIAGNOSIS — I10 Essential (primary) hypertension: Secondary | ICD-10-CM | POA: Diagnosis not present

## 2020-11-19 DIAGNOSIS — E7849 Other hyperlipidemia: Secondary | ICD-10-CM | POA: Diagnosis not present

## 2020-11-19 DIAGNOSIS — I10 Essential (primary) hypertension: Secondary | ICD-10-CM | POA: Diagnosis not present

## 2020-12-17 DIAGNOSIS — M1991 Primary osteoarthritis, unspecified site: Secondary | ICD-10-CM | POA: Diagnosis not present

## 2020-12-17 DIAGNOSIS — G894 Chronic pain syndrome: Secondary | ICD-10-CM | POA: Diagnosis not present

## 2020-12-17 DIAGNOSIS — I10 Essential (primary) hypertension: Secondary | ICD-10-CM | POA: Diagnosis not present

## 2021-01-16 DIAGNOSIS — I10 Essential (primary) hypertension: Secondary | ICD-10-CM | POA: Diagnosis not present

## 2021-01-16 DIAGNOSIS — M1991 Primary osteoarthritis, unspecified site: Secondary | ICD-10-CM | POA: Diagnosis not present

## 2021-01-16 DIAGNOSIS — G894 Chronic pain syndrome: Secondary | ICD-10-CM | POA: Diagnosis not present

## 2021-01-21 DIAGNOSIS — H04123 Dry eye syndrome of bilateral lacrimal glands: Secondary | ICD-10-CM | POA: Diagnosis not present

## 2021-01-21 DIAGNOSIS — H401131 Primary open-angle glaucoma, bilateral, mild stage: Secondary | ICD-10-CM | POA: Diagnosis not present

## 2021-02-17 DIAGNOSIS — K219 Gastro-esophageal reflux disease without esophagitis: Secondary | ICD-10-CM | POA: Diagnosis not present

## 2021-02-17 DIAGNOSIS — M47816 Spondylosis without myelopathy or radiculopathy, lumbar region: Secondary | ICD-10-CM | POA: Diagnosis not present

## 2021-02-17 DIAGNOSIS — I10 Essential (primary) hypertension: Secondary | ICD-10-CM | POA: Diagnosis not present

## 2021-02-17 DIAGNOSIS — G894 Chronic pain syndrome: Secondary | ICD-10-CM | POA: Diagnosis not present

## 2021-03-19 DIAGNOSIS — M1991 Primary osteoarthritis, unspecified site: Secondary | ICD-10-CM | POA: Diagnosis not present

## 2021-03-19 DIAGNOSIS — Z23 Encounter for immunization: Secondary | ICD-10-CM | POA: Diagnosis not present

## 2021-03-19 DIAGNOSIS — K219 Gastro-esophageal reflux disease without esophagitis: Secondary | ICD-10-CM | POA: Diagnosis not present

## 2021-03-19 DIAGNOSIS — E782 Mixed hyperlipidemia: Secondary | ICD-10-CM | POA: Diagnosis not present

## 2021-03-19 DIAGNOSIS — G894 Chronic pain syndrome: Secondary | ICD-10-CM | POA: Diagnosis not present

## 2021-03-19 DIAGNOSIS — I10 Essential (primary) hypertension: Secondary | ICD-10-CM | POA: Diagnosis not present

## 2021-04-22 ENCOUNTER — Encounter: Payer: Self-pay | Admitting: Cardiology

## 2021-04-22 DIAGNOSIS — I10 Essential (primary) hypertension: Secondary | ICD-10-CM | POA: Diagnosis not present

## 2021-04-22 DIAGNOSIS — M1991 Primary osteoarthritis, unspecified site: Secondary | ICD-10-CM | POA: Diagnosis not present

## 2021-04-22 DIAGNOSIS — E782 Mixed hyperlipidemia: Secondary | ICD-10-CM | POA: Diagnosis not present

## 2021-04-22 DIAGNOSIS — G894 Chronic pain syndrome: Secondary | ICD-10-CM | POA: Diagnosis not present

## 2021-04-22 DIAGNOSIS — R002 Palpitations: Secondary | ICD-10-CM | POA: Diagnosis not present

## 2021-05-08 DIAGNOSIS — U071 COVID-19: Secondary | ICD-10-CM | POA: Diagnosis not present

## 2021-05-22 DIAGNOSIS — G894 Chronic pain syndrome: Secondary | ICD-10-CM | POA: Diagnosis not present

## 2021-05-22 DIAGNOSIS — Z23 Encounter for immunization: Secondary | ICD-10-CM | POA: Diagnosis not present

## 2021-05-22 DIAGNOSIS — I10 Essential (primary) hypertension: Secondary | ICD-10-CM | POA: Diagnosis not present

## 2021-05-22 DIAGNOSIS — M1991 Primary osteoarthritis, unspecified site: Secondary | ICD-10-CM | POA: Diagnosis not present

## 2021-05-22 DIAGNOSIS — R002 Palpitations: Secondary | ICD-10-CM | POA: Diagnosis not present

## 2021-06-25 DIAGNOSIS — M1991 Primary osteoarthritis, unspecified site: Secondary | ICD-10-CM | POA: Diagnosis not present

## 2021-06-25 DIAGNOSIS — Z9229 Personal history of other drug therapy: Secondary | ICD-10-CM | POA: Diagnosis not present

## 2021-06-25 DIAGNOSIS — Z0001 Encounter for general adult medical examination with abnormal findings: Secondary | ICD-10-CM | POA: Diagnosis not present

## 2021-06-25 DIAGNOSIS — I10 Essential (primary) hypertension: Secondary | ICD-10-CM | POA: Diagnosis not present

## 2021-06-25 DIAGNOSIS — E538 Deficiency of other specified B group vitamins: Secondary | ICD-10-CM | POA: Diagnosis not present

## 2021-06-25 DIAGNOSIS — E7849 Other hyperlipidemia: Secondary | ICD-10-CM | POA: Diagnosis not present

## 2021-06-25 DIAGNOSIS — D649 Anemia, unspecified: Secondary | ICD-10-CM | POA: Diagnosis not present

## 2021-06-25 DIAGNOSIS — G894 Chronic pain syndrome: Secondary | ICD-10-CM | POA: Diagnosis not present

## 2021-06-25 DIAGNOSIS — R002 Palpitations: Secondary | ICD-10-CM | POA: Diagnosis not present

## 2021-07-02 ENCOUNTER — Ambulatory Visit: Payer: Medicare Other | Admitting: Cardiology

## 2021-07-02 ENCOUNTER — Other Ambulatory Visit: Payer: Self-pay

## 2021-07-02 ENCOUNTER — Other Ambulatory Visit: Payer: Self-pay | Admitting: Cardiology

## 2021-07-02 ENCOUNTER — Ambulatory Visit (INDEPENDENT_AMBULATORY_CARE_PROVIDER_SITE_OTHER): Payer: Medicare Other

## 2021-07-02 ENCOUNTER — Encounter: Payer: Self-pay | Admitting: Cardiology

## 2021-07-02 VITALS — BP 150/76 | HR 72 | Ht 74.0 in | Wt 214.2 lb

## 2021-07-02 DIAGNOSIS — I10 Essential (primary) hypertension: Secondary | ICD-10-CM

## 2021-07-02 DIAGNOSIS — R002 Palpitations: Secondary | ICD-10-CM

## 2021-07-02 MED ORDER — ATENOLOL 100 MG PO TABS: 100.0000 mg | ORAL_TABLET | Freq: Two times a day (BID) | ORAL | 1 refills | Status: DC

## 2021-07-02 NOTE — Progress Notes (Signed)
Cardiology Office Note  Date: 07/02/2021   ID: William Gill, William Gill, William Gill, MRN [id number]  PCP:  Redmond School, MD  Cardiologist:  Rozann Lesches, MD Electrophysiologist:  None   Chief Complaint  Patient presents with   Palpitations    History of Present Illness: William Gill is a 66 y.o. male last seen in the office in April 2021, now referred back by Dr. Gerarda Fraction for evaluation of palpitations.  He states that since October 2022 he has been feeling intermittent skips and palpitations, sometimes daily but not exclusively.  He had an episode over the weekend where he felt like his heart rate was faster than normal at baseline, this lasted for a few hours.  He has had no chest pain or syncope.  No obvious change in health, he has cut back caffeine.  Reports compliance with his medications including atenolol which was increased to 100 mg in the morning and 75 mg in the evening by PCP.  Cardiac monitor in May 2021 demonstrated rare PACs and PVCs as well as two brief episodes of SVT.  Past Medical History:  Diagnosis Date   Arthritis    Chronic back pain    DDD (degenerative disc disease), cervical    GERD (gastroesophageal reflux disease)    History of bronchitis winter 2014   Hyperlipidemia    Hypertension     Past Surgical History:  Procedure Laterality Date   ANKLE SURGERY Left    BACK SURGERY     microdiscectomy   BIOPSY  09/30/2020   Procedure: BIOPSY;  Surgeon: Eloise Harman, DO;  Location: [facility];  Service: Endoscopy;;   CARDIAC CATHETERIZATION     CERVICAL FUSION     COLONOSCOPY     COLONOSCOPY N/A 01/04/2015   Procedure: COLONOSCOPY;  Surgeon: Aviva Signs, MD;  Location: [facility];  Service: Gastroenterology;  Laterality: N/A;  730   ESOPHAGOGASTRODUODENOSCOPY (EGD) WITH PROPOFOL N/A 09/30/2020   Procedure: ESOPHAGOGASTRODUODENOSCOPY (EGD) WITH PROPOFOL;  Surgeon: Eloise Harman, DO;  Location: [facility];  Service: Endoscopy;   Laterality: N/A;  am appt   SHOULDER ARTHROSCOPY Right    TONSILLECTOMY     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     atenolol (TENORMIN) 100 MG tablet Take 1 tablet (100 mg total) by mouth 2 (two) times daily. 180 tablet 1   atorvastatin (LIPITOR) 80 MG tablet Take 80 mg by mouth daily.     Coenzyme Q10 (CO Q 10) 100 MG CAPS Take 100 mg by mouth daily.     cyclobenzaprine (FLEXERIL) 10 MG tablet Take 1 tablet (10 mg total) by mouth 3 (three) times daily as needed for muscle spasms. 60 tablet 1   esomeprazole (NEXIUM) 40 MG capsule Take 40 mg by mouth 2 (two) times daily before a meal.     ezetimibe (ZETIA) 10 MG tablet Take 10 mg by mouth at bedtime.     fenofibrate 160 MG tablet Take 160 mg by mouth daily.     Flaxseed, Linseed, (FLAX SEED OIL PO) Take 1,200 mg by mouth daily.     furosemide (LASIX) 20 MG tablet Take  20 mg by mouth daily.     HYDROcodone-acetaminophen (NORCO) 10-325 MG per tablet Take 1 tablet by mouth 3 (three) times daily as needed for moderate pain or severe pain. As needed     lisinopril (PRINIVIL,ZESTRIL) 10 MG tablet Take 10 mg by mouth daily.     Multiple Vitamin (MULTIVITAMIN) tablet Take 1 tablet by mouth daily.     ROCKLATAN 0.02-0.005 % SOLN Place 1 drop into both eyes at bedtime.     No current facility-administered medications for this visit.   Allergies:  Patient has no known allergies.   ROS: No syncope.  Physical Exam: VS:  BP (!) 150/76    Pulse 72    Ht 6\' 2"  (1.88 m)    Wt 214 lb 3.2 oz (97.2 kg)    SpO2 98%    BMI 27.50 kg/m , BMI Body mass index is 27.5 kg/m.  Wt Readings from Last 3 Encounters:  07/02/21  214 lb 3.2 oz (97.2 kg)  09/30/20 215 lb (97.5 kg)  09/12/20 218 lb 3.2 oz (99 kg)    General: Patient appears comfortable at rest. HEENT: Conjunctiva and lids normal, wearing a mask. Neck: Supple, no elevated JVP or carotid bruits, no thyromegaly. Lungs: Clear to auscultation, nonlabored breathing at rest. Cardiac: Regular rate and rhythm, no S3, 1/6 systolic murmur, no pericardial rub. Extremities: No pitting edema.  ECG:  An ECG dated 04/22/2021 was personally reviewed today and demonstrated:  Sinus rhythm.  Recent Labwork: 09/27/2020: BUN 16; Creatinine, Ser 1.22; Potassium 4.2; Sodium 136  January 2022: Hemoglobin 12.8, platelets 384, BUN 18, creatinine 1.3, potassium 5.4, AST 23, ALT 19, hemoglobin A1c 5.8%, cholesterol 177, triglycerides 135, HDL 35, LDL 118, TSH 2.83  Other Studies Reviewed Today:  Echocardiogram 10/24/2019:  1. Left ventricular ejection fraction, by estimation, is 60 to 65%. The  left ventricle has normal function. The left ventricle has no regional  wall motion abnormalities. Left ventricular diastolic parameters were  normal.   2. Right ventricular systolic function is normal. The right ventricular  size is normal. Tricuspid regurgitation signal is inadequate for assessing  PA pressure.   3. The mitral valve is grossly normal. Trivial mitral valve  regurgitation.   4. The aortic valve is tricuspid. Aortic valve regurgitation is not  visualized.   5. The inferior vena cava is normal in size with greater than 50%  respiratory variability, suggesting right atrial pressure of 3 mmHg.   Cardiac monitor May 2021: ZIO XT reviewed.  3 days.  Predominant rhythm is sinus with heart rate ranging from 53 bpm up to 109 bpm and average heart rate 74 bpm.  Rare PACs and PVCs were noted representing less than 1% of total beats. There were two sequential, brief episodes of SVT, the first lasted 6 beats followed by a burst of 8 beats.  No sustained arrhythmias were noted and  there were no pauses.  Assessment and Plan:  1.  Palpitations, increasing symptoms since October 2022.  No associated chest pain or syncope.  I reviewed his interval ECG from PCP office showing sinus rhythm.  Prior cardiac monitor indicated rare PACs and PVCs with very brief burst of SVT, no sustained arrhythmias at that time.  He indicates worsening symptoms on stable medical therapy including up titration of atenolol.  Plan is to increase atenolol to 100 mg twice daily and follow-up with a 7-day Zio patch.  2.  Essential hypertension, also on lisinopril and Norvasc.  3.  Mixed hyperlipidemia, on  high-dose Lipitor as well as Zetia.  Medication Adjustments/Labs and Tests Ordered: Current medicines are reviewed at length with the patient today.  Concerns regarding medicines are outlined above.   Tests Ordered: No orders of the defined types were placed in this encounter.   Medication Changes: Meds ordered this encounter  Medications   atenolol (TENORMIN) 100 MG tablet    Sig: Take 1 tablet (100 mg total) by mouth 2 (two) times daily.    Dispense:  180 tablet    Refill:  1    07/02/2021 dose increase    Disposition:  Follow up  test results.  Signed, Satira Sark, MD, [facility] 07/02/2021 4:02 PM    [facility] [address], [place], [place] [address] Phone: [phone]; Fax: [phone]

## 2021-07-02 NOTE — Patient Instructions (Addendum)
Medication Instructions:  Your physician has recommended you make the following change in your medication:  Increase atenolol to 100 mg twice daily Continue other medications the same  Labwork: none  Testing/Procedures: ZIO- Long Term Monitor Instructions   Your physician has requested you wear your ZIO patch monitor 7 days.   This is a single patch monitor.  [employer] supplies one patch monitor per enrollment.  Additional stickers are not available.   Please do not apply patch if you will be having a Nuclear Stress Test, Echocardiogram, Cardiac CT, MRI, or Chest Xray during the time frame you would be wearing the monitor. The patch cannot be worn during these tests.  You cannot remove and re-apply the ZIO XT patch monitor.     Once you have received you monitor, please review enclosed instructions.  Your monitor has already been registered assigning a specific monitor serial # to you.   Applying the monitor   Shave hair from upper left chest.   Hold abrader disc by orange tab.  Rub abrader in 40 strokes over left upper chest as indicated in your monitor instructions.   Clean area with 4 enclosed alcohol pads .  Use all pads to assure are is cleaned thoroughly.  Let dry.   Apply patch as indicated in monitor instructions.  Patch will be place under collarbone on left side of chest with arrow pointing upward.   Rub patch adhesive wings for 2 minutes.Remove white label marked "1".  Remove white label marked "2".  Rub patch adhesive wings for 2 additional minutes.   While looking in a mirror, press and release button in center of patch.  A small green light will flash 3-4 times .  This will be your only indicator the monitor has been turned on.     Do not shower for the first 24 hours.  You may shower after the first 24 hours.   Press button if you feel a symptom. You will hear a small click.  Record Date, Time and Symptom in the Patient Log Book.   When you are ready to remove  patch, follow instructions on last 2 pages of Patient Log Book.  Stick patch monitor onto last page of Patient Log Book.   Place Patient Log Book in [place] box.  Use locking tab on box and tape box closed securely.  The [employer] has [employer] on it.  Please place in mailbox as soon as possible.  Your physician should have your test results approximately 7 days after the monitor has been mailed back to [facility]   Call [place] at [phone] if you have questions regarding your ZIO XT patch monitor.  Call them immediately if you see an orange light blinking on your monitor.   If your monitor falls off in less than 4 days contact our Monitor department at [phone].  If your monitor becomes loose or falls off after 4 days call [employer] at [phone] for suggestions on securing your monitor.  Follow-Up: Your physician recommends that you schedule a follow-up appointment in: pending  Any Other Special Instructions Will Be Listed Below (If Applicable).  If you need a refill on your cardiac medications before your next appointment, please call your pharmacy.

## 2021-07-10 DIAGNOSIS — Z1211 Encounter for screening for malignant neoplasm of colon: Secondary | ICD-10-CM | POA: Diagnosis not present

## 2021-07-15 DIAGNOSIS — R002 Palpitations: Secondary | ICD-10-CM | POA: Diagnosis not present

## 2021-07-23 ENCOUNTER — Telehealth: Payer: Self-pay | Admitting: Cardiology

## 2021-07-23 NOTE — Telephone Encounter (Signed)
Per Dr. Domenic Polite:  Results reviewed.  Cardiac monitor shows only rare PACs and PVCs, no significant arrhythmias or pauses.  Average heart rate in the 70s.  Continue current dose of beta-blocker for now.

## 2021-07-23 NOTE — Telephone Encounter (Signed)
Patient verbalized understanding of monitor results. Pt had no questions or concerns at this time.

## 2021-07-23 NOTE — Telephone Encounter (Signed)
Patient is returning call to discuss monitor results. 

## 2021-07-24 DIAGNOSIS — I440 Atrioventricular block, first degree: Secondary | ICD-10-CM | POA: Diagnosis not present

## 2021-07-24 DIAGNOSIS — I471 Supraventricular tachycardia: Secondary | ICD-10-CM | POA: Diagnosis not present

## 2021-07-24 DIAGNOSIS — M5136 Other intervertebral disc degeneration, lumbar region: Secondary | ICD-10-CM | POA: Diagnosis not present

## 2021-07-24 DIAGNOSIS — G894 Chronic pain syndrome: Secondary | ICD-10-CM | POA: Diagnosis not present

## 2021-07-24 DIAGNOSIS — R002 Palpitations: Secondary | ICD-10-CM | POA: Diagnosis not present

## 2021-08-14 DIAGNOSIS — H04123 Dry eye syndrome of bilateral lacrimal glands: Secondary | ICD-10-CM | POA: Diagnosis not present

## 2021-08-14 DIAGNOSIS — H35371 Puckering of macula, right eye: Secondary | ICD-10-CM | POA: Diagnosis not present

## 2021-08-14 DIAGNOSIS — H401131 Primary open-angle glaucoma, bilateral, mild stage: Secondary | ICD-10-CM | POA: Diagnosis not present

## 2021-08-14 DIAGNOSIS — H524 Presbyopia: Secondary | ICD-10-CM | POA: Diagnosis not present

## 2021-08-14 DIAGNOSIS — Z961 Presence of intraocular lens: Secondary | ICD-10-CM | POA: Diagnosis not present

## 2021-08-26 DIAGNOSIS — G894 Chronic pain syndrome: Secondary | ICD-10-CM | POA: Diagnosis not present

## 2021-08-26 DIAGNOSIS — M5136 Other intervertebral disc degeneration, lumbar region: Secondary | ICD-10-CM | POA: Diagnosis not present

## 2021-08-26 DIAGNOSIS — K219 Gastro-esophageal reflux disease without esophagitis: Secondary | ICD-10-CM | POA: Diagnosis not present

## 2021-08-26 DIAGNOSIS — I10 Essential (primary) hypertension: Secondary | ICD-10-CM | POA: Diagnosis not present

## 2021-09-25 DIAGNOSIS — M5136 Other intervertebral disc degeneration, lumbar region: Secondary | ICD-10-CM | POA: Diagnosis not present

## 2021-09-25 DIAGNOSIS — T50905A Adverse effect of unspecified drugs, medicaments and biological substances, initial encounter: Secondary | ICD-10-CM | POA: Diagnosis not present

## 2021-09-25 DIAGNOSIS — I10 Essential (primary) hypertension: Secondary | ICD-10-CM | POA: Diagnosis not present

## 2021-09-25 DIAGNOSIS — Z9229 Personal history of other drug therapy: Secondary | ICD-10-CM | POA: Diagnosis not present

## 2021-09-25 DIAGNOSIS — M1991 Primary osteoarthritis, unspecified site: Secondary | ICD-10-CM | POA: Diagnosis not present

## 2021-09-25 DIAGNOSIS — R002 Palpitations: Secondary | ICD-10-CM | POA: Diagnosis not present

## 2021-09-25 DIAGNOSIS — I471 Supraventricular tachycardia: Secondary | ICD-10-CM | POA: Diagnosis not present

## 2021-09-25 DIAGNOSIS — G894 Chronic pain syndrome: Secondary | ICD-10-CM | POA: Diagnosis not present

## 2021-10-09 NOTE — Progress Notes (Signed)
? ? ?Cardiology Office Note ? ?Date: 10/10/2021  ? ?ID: William Gill, DOB 1956/03/20, MRN [id number] ? ?PCP:  Redmond School, MD  ?Cardiologist:  Rozann Lesches, MD ?Electrophysiologist:  None  ? ?Chief Complaint  ?Patient presents with  ? Cardiac follow-up  ? ? ?History of Present Illness: ?William Gill is a 66 y.o. male last seen in January.  He scheduled a follow-up visit to discuss symptoms. Follow-up cardiac monitor in January is noted below, only rare PACs and PVCs with no significant arrhythmias.  Atenolol was increased to 100 mg twice daily at last visit given reported increase in palpitations.  He states that he felt fatigued after this and palpitations continued, however earlier this month they suddenly resolved after an episode of atypical, sharp chest discomfort.  He has felt better recently.  No exertional chest pain or syncope. ? ?Past Medical History:  ?Diagnosis Date  ? Arthritis   ? Chronic back pain   ? DDD (degenerative disc disease), cervical   ? GERD (gastroesophageal reflux disease)   ? History of bronchitis winter 2014  ? Hyperlipidemia   ? Hypertension   ? ? ?Past Surgical History:  ?Procedure Laterality Date  ? ANKLE SURGERY Left   ? BACK SURGERY    ? microdiscectomy  ? BIOPSY  09/30/2020  ? Procedure: BIOPSY;  Surgeon: Eloise Harman, DO;  [facility];  Service: Endoscopy;;  ? CARDIAC CATHETERIZATION    ? CERVICAL FUSION    ? COLONOSCOPY    ? COLONOSCOPY N/A 01/04/2015  ? Procedure: COLONOSCOPY;  Surgeon: Aviva Signs, MD;  [facility];  Service: Gastroenterology;  Laterality: N/A;  730  ? ESOPHAGOGASTRODUODENOSCOPY (EGD) WITH PROPOFOL N/A 09/30/2020  ? Procedure: ESOPHAGOGASTRODUODENOSCOPY (EGD) WITH PROPOFOL;  Surgeon: Eloise Harman, DO;  [facility];  Service: Endoscopy;  Laterality: N/A;  am appt  ? SHOULDER ARTHROSCOPY Right   ? TONSILLECTOMY    ? TOTAL SHOULDER ARTHROPLASTY Right 02/22/2014  ? dr supple   ? TOTAL SHOULDER ARTHROPLASTY Right  02/22/2014  ? Procedure: RIGHT TOTAL SHOULDER ARTHROPLASTY;  Surgeon: Marin Shutter, MD;  Location: [place];  Service: Orthopedics;  Laterality: Right;  ? ? ?Current Outpatient Medications  ?Medication Sig Dispense Refill  ? amLODipine (NORVASC) 10 MG tablet Take 10 mg by mouth every morning.    ? atenolol (TENORMIN) 100 MG tablet Take 1 tablet (100 mg total) by mouth 2 (two) times daily. 180 tablet 1  ? atorvastatin (LIPITOR) 80 MG tablet Take 80 mg by mouth daily.    ? Coenzyme Q10 (CO Q 10) 100 MG CAPS Take 100 mg by mouth daily.    ? cyclobenzaprine (FLEXERIL) 10 MG tablet Take 1 tablet (10 mg total) by mouth 3 (three) times daily as needed for muscle spasms. 60 tablet 1  ? esomeprazole (NEXIUM) 40 MG capsule Take 40 mg by mouth 2 (two) times daily before a meal.    ? ezetimibe (ZETIA) 10 MG tablet Take 10 mg by mouth at bedtime.    ? fenofibrate 160 MG tablet Take 160 mg by mouth daily.    ? furosemide (LASIX) 20 MG tablet Take 20 mg by mouth daily.    ? HYDROcodone-acetaminophen (NORCO) 10-325 MG per tablet Take 1 tablet by mouth 3 (three) times daily as needed for moderate pain or severe pain. As needed    ? latanoprost (XALATAN) 0.005 % ophthalmic solution Place 1 drop into both eyes at bedtime.    ? lisinopril (  PRINIVIL,ZESTRIL) 10 MG tablet Take 10 mg by mouth daily.    ? Multiple Vitamin (MULTIVITAMIN) tablet Take 1 tablet by mouth daily.    ? Flaxseed, Linseed, (FLAX SEED OIL PO) Take 1,200 mg by mouth daily. (Patient not taking: Reported on 10/10/2021)    ? ROCKLATAN 0.02-0.005 % SOLN Place 1 drop into both eyes at bedtime. (Patient not taking: Reported on 10/10/2021)    ? ?No current facility-administered medications for this visit.  ? ?Allergies:  Patient has no known allergies.  ? ?ROS: No orthopnea or PND. ? ?Physical Exam: ?VS:  BP (!) 142/78   Pulse 67   Ht '6\' 2"'$  (1.88 m)   Wt 215 lb (97.5 kg)   SpO2 99%   BMI 27.60 kg/m? , BMI Body mass index is 27.6 kg/m?. ? ?Wt Readings from Last 3 Encounters:   ?10/10/21 215 lb (97.5 kg)  ?07/02/21 214 lb 3.2 oz (97.2 kg)  ?09/30/20 215 lb (97.5 kg)  ?  ?General: Patient appears comfortable at rest. ?HEENT: Conjunctiva and lids normal. ?Lungs: Clear to auscultation, nonlabored breathing at rest. ?Cardiac: Regular rate and rhythm, no S3 or significant systolic murmur, no pericardial rub. ? ?ECG:  An ECG dated 04/22/2021 was personally reviewed today and demonstrated:  Sinus rhythm. ? ?Recent Labwork: ? ?April 2022: BUN 16; Creatinine, Ser 1.22; Potassium 4.2; Sodium 136  ?January 2022: Hemoglobin 12.8, platelets 384, BUN 18, creatinine 1.3, potassium 5.4, AST 23, ALT 19, hemoglobin A1c 5.8%, cholesterol 177, triglycerides 135, HDL 35, LDL 118, TSH 2.83 ? ?Other Studies Reviewed Today: ? ?Echocardiogram 10/24/2019: ? 1. Left ventricular ejection fraction, by estimation, is 60 to 65%. The  ?left ventricle has normal function. The left ventricle has no regional  ?wall motion abnormalities. Left ventricular diastolic parameters were  ?normal.  ? 2. Right ventricular systolic function is normal. The right ventricular  ?size is normal. Tricuspid regurgitation signal is inadequate for assessing  ?PA pressure.  ? 3. The mitral valve is grossly normal. Trivial mitral valve  ?regurgitation.  ? 4. The aortic valve is tricuspid. Aortic valve regurgitation is not  ?visualized.  ? 5. The inferior vena cava is normal in size with greater than 50%  ?respiratory variability, suggesting right atrial pressure of 3 mmHg.  ?  ?Cardiac monitor January 2023: ?ZIO XT reviewed.  6 days, 23 hours analyzed.  Predominant rhythm is sinus with prolonged PR interval, heart rate ranging from 55 bpm up to 103 bpm and average heart rate 74 bpm.  Changes in QRS duration noted, no evidence of heart block or pauses. ?  ?There were rare PACs including couplets and triplets representing 1% total beats.  There were also rare PVCs including couplets representing less than 1% total beats.  Brief ventricular bigeminy  and trigeminy noted.  No ventricular tachycardia.  No sustained arrhythmias. ? ?Assessment and Plan: ? ?Palpitations with history of atrial and ventricular ectopy, also very brief burst of SVT.  Plan for now is to continue atenolol 100 mg twice daily, he is tolerating this better and his palpitations suddenly resolved earlier this month.  No associated syncope.  Continue observation for now. ? ? ?Medication Adjustments/Labs and Tests Ordered: ?Current medicines are reviewed at length with the patient today.  Concerns regarding medicines are outlined above.  ? ?Tests Ordered: ?No orders of the defined types were placed in this encounter. ? ? ?Medication Changes: ?No orders of the defined types were placed in this encounter. ? ? ?Disposition:  Follow up  6  months. ? ?Signed, ?Satira Sark, MD, [facility] ?10/10/2021 8:39 AM    ?[place] [facility] ?[address], [place], [place] [address] ?Phone: [phone]; Fax: [phone]  ?

## 2021-10-10 ENCOUNTER — Ambulatory Visit: Payer: Medicare Other | Admitting: Cardiology

## 2021-10-10 ENCOUNTER — Encounter: Payer: Self-pay | Admitting: Cardiology

## 2021-10-10 VITALS — BP 142/78 | HR 67 | Ht 74.0 in | Wt 215.0 lb

## 2021-10-10 DIAGNOSIS — R002 Palpitations: Secondary | ICD-10-CM

## 2021-10-10 MED ORDER — ATENOLOL 100 MG PO TABS: 100.0000 mg | ORAL_TABLET | Freq: Two times a day (BID) | ORAL | 2 refills | Status: DC

## 2021-10-10 NOTE — Patient Instructions (Addendum)

## 2021-10-28 DIAGNOSIS — I10 Essential (primary) hypertension: Secondary | ICD-10-CM | POA: Diagnosis not present

## 2021-10-28 DIAGNOSIS — G894 Chronic pain syndrome: Secondary | ICD-10-CM | POA: Diagnosis not present

## 2021-10-28 DIAGNOSIS — M1991 Primary osteoarthritis, unspecified site: Secondary | ICD-10-CM | POA: Diagnosis not present

## 2021-11-12 ENCOUNTER — Other Ambulatory Visit: Payer: Self-pay | Admitting: Cardiology

## 2021-12-04 DIAGNOSIS — G894 Chronic pain syndrome: Secondary | ICD-10-CM | POA: Diagnosis not present

## 2021-12-04 DIAGNOSIS — M5136 Other intervertebral disc degeneration, lumbar region: Secondary | ICD-10-CM | POA: Diagnosis not present

## 2022-01-30 DIAGNOSIS — K047 Periapical abscess without sinus: Secondary | ICD-10-CM | POA: Diagnosis not present

## 2022-01-30 DIAGNOSIS — M5136 Other intervertebral disc degeneration, lumbar region: Secondary | ICD-10-CM | POA: Diagnosis not present

## 2022-01-30 DIAGNOSIS — I10 Essential (primary) hypertension: Secondary | ICD-10-CM | POA: Diagnosis not present

## 2022-01-30 DIAGNOSIS — M1991 Primary osteoarthritis, unspecified site: Secondary | ICD-10-CM | POA: Diagnosis not present

## 2022-02-27 DIAGNOSIS — G894 Chronic pain syndrome: Secondary | ICD-10-CM | POA: Diagnosis not present

## 2022-02-27 DIAGNOSIS — M5136 Other intervertebral disc degeneration, lumbar region: Secondary | ICD-10-CM | POA: Diagnosis not present

## 2022-02-27 DIAGNOSIS — I10 Essential (primary) hypertension: Secondary | ICD-10-CM | POA: Diagnosis not present

## 2022-02-27 DIAGNOSIS — M1991 Primary osteoarthritis, unspecified site: Secondary | ICD-10-CM | POA: Diagnosis not present

## 2022-04-13 DIAGNOSIS — Q141 Congenital malformation of retina: Secondary | ICD-10-CM | POA: Diagnosis not present

## 2022-04-13 DIAGNOSIS — H401131 Primary open-angle glaucoma, bilateral, mild stage: Secondary | ICD-10-CM | POA: Diagnosis not present

## 2022-04-24 DIAGNOSIS — I10 Essential (primary) hypertension: Secondary | ICD-10-CM | POA: Diagnosis not present

## 2022-04-24 DIAGNOSIS — G894 Chronic pain syndrome: Secondary | ICD-10-CM | POA: Diagnosis not present

## 2022-04-24 DIAGNOSIS — Z23 Encounter for immunization: Secondary | ICD-10-CM | POA: Diagnosis not present

## 2022-04-24 DIAGNOSIS — M5136 Other intervertebral disc degeneration, lumbar region: Secondary | ICD-10-CM | POA: Diagnosis not present

## 2022-04-24 DIAGNOSIS — M1991 Primary osteoarthritis, unspecified site: Secondary | ICD-10-CM | POA: Diagnosis not present

## 2022-04-28 NOTE — Progress Notes (Unsigned)
Cardiology Office Note  Date: 04/29/2022   ID: [name], William Gill 1955-11-03, MRN [id number]  PCP:  Redmond School, MD  Cardiologist:  Rozann Lesches, MD Electrophysiologist:  None   Chief Complaint  Patient presents with   Cardiac follow-up    History of Present Illness: William Gill is a 66 y.o. male last seen in April.  He is here for routine visit.  Reports no sense of palpitations in the interim, has done well on current dose of atenolol.  He reports NYHA class I dyspnea, no angina, no major functional limitation.  Continues to follow at [facility] for [facility] care.  Blood pressure was elevated today, we discussed this.  He does have a cuff to check blood pressure at home, I encouraged him to keep an eye on things and check back in with Dr. Gerarda Fraction.  He is on atenolol, Norvasc, and lisinopril at this point.  I personally reviewed his ECG today which shows normal sinus rhythm with decreased R wave progression.  Past Medical History:  Diagnosis Date   Arthritis    Chronic back pain    DDD (degenerative disc disease), cervical    GERD (gastroesophageal reflux disease)    History of bronchitis winter 2014   Hyperlipidemia    Hypertension     Past Surgical History:  Procedure Laterality Date   ANKLE SURGERY Left    BACK SURGERY     microdiscectomy   BIOPSY  09/30/2020   Procedure: BIOPSY;  Surgeon: Eloise Harman, DO;  [facility];  Service: Endoscopy;;   CARDIAC CATHETERIZATION     CERVICAL FUSION     COLONOSCOPY     COLONOSCOPY N/A 01/04/2015   Procedure: COLONOSCOPY;  Surgeon: Aviva Signs, MD;  [facility];  Service: Gastroenterology;  Laterality: N/A;  730   ESOPHAGOGASTRODUODENOSCOPY (EGD) WITH PROPOFOL N/A 09/30/2020   Procedure: ESOPHAGOGASTRODUODENOSCOPY (EGD) WITH PROPOFOL;  Surgeon: Eloise Harman, DO;  [facility];  Service: Endoscopy;  Laterality: N/A;  am appt   SHOULDER ARTHROSCOPY Right    TONSILLECTOMY      TOTAL SHOULDER ARTHROPLASTY Right 02/22/2014   dr supple    TOTAL SHOULDER ARTHROPLASTY Right 02/22/2014   Procedure: RIGHT TOTAL SHOULDER ARTHROPLASTY;  Surgeon: Marin Shutter, MD;  Location: [place];  Service: Orthopedics;  Laterality: Right;    Current Outpatient Medications  Medication Sig Dispense Refill   amLODipine (NORVASC) 10 MG tablet Take 10 mg by mouth every morning.     atenolol (TENORMIN) 100 MG tablet TAKE 1 TABLET BY MOUTH TWICE  DAILY 180 tablet 3   atorvastatin (LIPITOR) 80 MG tablet Take 80 mg by mouth daily.     Coenzyme Q10 (CO Q 10) 100 MG CAPS Take 100 mg by mouth daily.     cyclobenzaprine (FLEXERIL) 10 MG tablet Take 1 tablet (10 mg total) by mouth 3 (three) times daily as needed for muscle spasms. 60 tablet 1   esomeprazole (NEXIUM) 40 MG capsule Take 40 mg by mouth 2 (two) times daily before a meal.     ezetimibe (ZETIA) 10 MG tablet Take 10 mg by mouth at bedtime.     fenofibrate 160 MG tablet Take 160 mg by mouth daily.     HYDROcodone-acetaminophen (NORCO) 10-325 MG per tablet Take 1 tablet by mouth 3 (three) times daily as needed for moderate pain or severe pain. As needed     latanoprost (XALATAN) 0.005 % ophthalmic solution Place 1 drop into both  eyes at bedtime.     lisinopril (PRINIVIL,ZESTRIL) 10 MG tablet Take 10 mg by mouth daily.     Multiple Vitamin (MULTIVITAMIN) tablet Take 1 tablet by mouth daily.     No current facility-administered medications for this visit.   Allergies:  Patient has no known allergies.   ROS: No syncope.  Physical Exam: VS:  BP (!) 150/90   Pulse 67   Ht '6\' 2"'$  (1.88 m)   Wt 214 lb 6.4 oz (97.3 kg)   SpO2 98%   BMI 27.53 kg/m , BMI Body mass index is 27.53 kg/m.  Wt Readings from Last 3 Encounters:  04/29/22 214 lb 6.4 oz (97.3 kg)  10/10/21 215 lb (97.5 kg)  07/02/21 214 lb 3.2 oz (97.2 kg)    General: Patient appears comfortable at rest. HEENT: Conjunctiva and lids normal. Neck: Supple, no elevated JVP. Lungs:  Clear to auscultation, nonlabored breathing at rest. Cardiac: Regular rate and rhythm, no S3 or significant systolic murmur, no pericardial rub. Extremities: No pitting edema.  ECG:  An ECG dated 04/22/2021 was personally reviewed today and demonstrated:  Sinus rhythm.  Recent Labwork:  January 2022: Hemoglobin 12.8, platelets 384, BUN 18, creatinine 1.3, potassium 5.4, AST 23, ALT 19, hemoglobin A1c 5.8%, cholesterol 177, triglycerides 135, HDL 35, LDL 118, TSH 2.27 September 2020: BUN 16; Creatinine, Ser 1.22; Potassium 4.2; Sodium 136   Other Studies Reviewed Today:  Echocardiogram 10/24/2019:  1. Left ventricular ejection fraction, by estimation, is 60 to 65%. The  left ventricle has normal function. The left ventricle has no regional  wall motion abnormalities. Left ventricular diastolic parameters were  normal.   2. Right ventricular systolic function is normal. The right ventricular  size is normal. Tricuspid regurgitation signal is inadequate for assessing  PA pressure.   3. The mitral valve is grossly normal. Trivial mitral valve  regurgitation.   4. The aortic valve is tricuspid. Aortic valve regurgitation is not  visualized.   5. The inferior vena cava is normal in size with greater than 50%  respiratory variability, suggesting right atrial pressure of 3 mmHg.    Cardiac monitor January 2023: ZIO XT reviewed.  6 days, 23 hours analyzed.  Predominant rhythm is sinus with prolonged PR interval, heart rate ranging from 55 bpm up to 103 bpm and average heart rate 74 bpm.  Changes in QRS duration noted, no evidence of heart block or pauses.   There were rare PACs including couplets and triplets representing 1% total beats.  There were also rare PVCs including couplets representing less than 1% total beats.  Brief ventricular bigeminy and trigeminy noted.  No ventricular tachycardia.  No sustained arrhythmias.  Assessment and Plan:  1.  History of palpitations with prior documentation  of atrial and ventricular ectopy as well as very brief burst of SVT.  Symptoms quiescent at this point on current dose of atenolol which will be continued.  ECG reviewed.  2.  Essential hypertension, blood pressure elevated today.  He continues on atenolol, Norvasc, and lisinopril.  I have encouraged him to check blood pressure periodically at home and report back in with his PCP in case further adjustments are necessary.  Medication Adjustments/Labs and Tests Ordered: Current medicines are reviewed at length with the patient today.  Concerns regarding medicines are outlined above.   Tests Ordered: Orders Placed This Encounter  Procedures   EKG 12-Lead    Medication Changes: No orders of the defined types were placed in this encounter.  Disposition:  Follow up  1 year.  Signed, Satira Sark, MD, [facility] 04/29/2022 10:11 AM    [place] [facility] [place]. [address] [place], [place] [address] Phone: [phone]; Fax: [phone]

## 2022-04-29 ENCOUNTER — Ambulatory Visit: Payer: Medicare Other | Attending: Cardiology | Admitting: Cardiology

## 2022-04-29 ENCOUNTER — Encounter: Payer: Self-pay | Admitting: *Deleted

## 2022-04-29 ENCOUNTER — Encounter: Payer: Self-pay | Admitting: Cardiology

## 2022-04-29 VITALS — BP 150/90 | HR 67 | Ht 74.0 in | Wt 214.4 lb

## 2022-04-29 DIAGNOSIS — R002 Palpitations: Secondary | ICD-10-CM | POA: Diagnosis not present

## 2022-04-29 DIAGNOSIS — I10 Essential (primary) hypertension: Secondary | ICD-10-CM

## 2022-04-29 NOTE — Patient Instructions (Addendum)

## 2022-07-08 ENCOUNTER — Telehealth (INDEPENDENT_AMBULATORY_CARE_PROVIDER_SITE_OTHER): Payer: Self-pay | Admitting: *Deleted

## 2022-07-08 ENCOUNTER — Encounter: Payer: Self-pay | Admitting: Internal Medicine

## 2022-07-08 ENCOUNTER — Ambulatory Visit (INDEPENDENT_AMBULATORY_CARE_PROVIDER_SITE_OTHER): Payer: Medicare Other | Admitting: Internal Medicine

## 2022-07-08 ENCOUNTER — Encounter (INDEPENDENT_AMBULATORY_CARE_PROVIDER_SITE_OTHER): Payer: Self-pay | Admitting: *Deleted

## 2022-07-08 VITALS — BP 139/78 | HR 67 | Temp 97.3°F | Ht 74.0 in | Wt 216.0 lb

## 2022-07-08 DIAGNOSIS — K648 Other hemorrhoids: Secondary | ICD-10-CM | POA: Diagnosis not present

## 2022-07-08 DIAGNOSIS — K625 Hemorrhage of anus and rectum: Secondary | ICD-10-CM | POA: Diagnosis not present

## 2022-07-08 DIAGNOSIS — K2100 Gastro-esophageal reflux disease with esophagitis, without bleeding: Secondary | ICD-10-CM

## 2022-07-08 DIAGNOSIS — K5904 Chronic idiopathic constipation: Secondary | ICD-10-CM | POA: Diagnosis not present

## 2022-07-08 NOTE — Patient Instructions (Signed)
We will schedule you for colonoscopy to further evaluate your rectal bleeding.  Continue on Nexium twice daily for your chronic reflux.  Agree with keeping your stools soft, try to avoid straining, try to avoid spending a lot of time on the toilet with bowel movements.  If no other identifiable cause of your bleeding found besides hemorrhoids, we can discuss setting you up for hemorrhoid banding for treatment purposes.  It was very nice seeing you again today.  Dr. Abbey Chatters  At [facility] we value your feedback. If you would like to leave [place] a [employer] review, please share your experience as we strive to create trusting relationships with our patients to provide genuine, compassionate, quality care.

## 2022-07-08 NOTE — Telephone Encounter (Signed)
PA approved via [facility]. Auth# [id number], DOS: Aug 04, 2022 - Nov 02, 2022

## 2022-07-08 NOTE — Progress Notes (Signed)
  Referring Provider: Fusco, Lawrence, MD Primary Care Physician:  Fusco, Lawrence, MD Primary GI:  Dr. Yaa Donnellan  Chief Complaint  Patient presents with   Rectal Bleeding    Patient here today due to having issues with rectal bleeding. He says the last time he saw any blood was 2-3 weeks ago. He has had issues with hemorrhoids in the past. No sight of dark stools. He does have occasional constipation and when he does he takes stools softeners prn. Patient denies any other current gi issues. He does report the eye doctor saw something on his eye exam that indicated he may have Polyps. Last Tcs was done by Dr. Jenkins in 2016. Gerd is controlled with esomeprazole 40 mg bid.     HPI:   William Gill is a 66 y.o. male who presents to clinic today for follow-up visit.  History of chronic GERD well-controlled on Nexium twice daily.  Underwent EGD April 2022 for Barrett's screening.  GE junction biopsies showed acid reflux changes, negative for intestinal metaplasia.  Gastric biopsies with gastritis negative for H. pylori.  States his reflux is currently well-controlled on Nexium twice daily.  No dysphagia odynophagia.  No epigastric or chest pain.  Does have rectal bleeding.  States he has had issues for years with hemorrhoids though over the last 3 weeks he has had increased bright red blood per rectum, primarily with bowel movements.  Does note some mild constipation.  Symptoms intermittent.  Takes stool softeners as needed.  Occasional straining.  No rectal pain or discomfort.  Last colonoscopy 2016 with diverticulosis underlies unremarkable.  Performed by Dr. Jenkins.  States he recently had an eye exam and had findings which may indicate he has polyps.  No abdominal pain.  No family history of colorectal malignancy.  No unintentional weight loss.  Past Medical History:  Diagnosis Date   Arthritis    Chronic back pain    DDD (degenerative disc disease), cervical    GERD (gastroesophageal  reflux disease)    History of bronchitis winter 2014   Hyperlipidemia    Hypertension     Past Surgical History:  Procedure Laterality Date   ANKLE SURGERY Left    BACK SURGERY     microdiscectomy   BIOPSY  09/30/2020   Procedure: BIOPSY;  Surgeon: Raul Torrance K, DO;  [facility]: [facility];  Service: Endoscopy;;   CARDIAC CATHETERIZATION     CERVICAL FUSION     COLONOSCOPY     COLONOSCOPY N/A 01/04/2015   Procedure: COLONOSCOPY;  Surgeon: Mark Jenkins, MD;  Location: [facility];  Service: Gastroenterology;  Laterality: N/A;  730   ESOPHAGOGASTRODUODENOSCOPY (EGD) WITH PROPOFOL N/A 09/30/2020   Procedure: ESOPHAGOGASTRODUODENOSCOPY (EGD) WITH PROPOFOL;  Surgeon: Juanantonio Stolar K, DO;  [facility]: [facility];  Service: Endoscopy;  Laterality: N/A;  am appt   SHOULDER ARTHROSCOPY Right    TONSILLECTOMY     TOTAL SHOULDER ARTHROPLASTY Right 02/22/2014   dr supple    TOTAL SHOULDER ARTHROPLASTY Right 02/22/2014   Procedure: RIGHT TOTAL SHOULDER ARTHROPLASTY;  Surgeon: Kevin M Supple, MD;  Location: [facility];  Service: Orthopedics;  Laterality: Right;    Current Outpatient Medications  Medication Sig Dispense Refill   amLODipine (NORVASC) 10 MG tablet Take 10 mg by mouth every morning.     atenolol (TENORMIN) 100 MG tablet TAKE 1 TABLET BY MOUTH TWICE  DAILY 180 tablet 3   atorvastatin (LIPITOR) 80 MG tablet Take 80 mg by mouth daily.       Coenzyme Q10 (CO Q 10) 100 MG CAPS Take 100 mg by mouth daily.     cyclobenzaprine (FLEXERIL) 10 MG tablet Take 1 tablet (10 mg total) by mouth 3 (three) times daily as needed for muscle spasms. 60 tablet 1   esomeprazole (NEXIUM) 40 MG capsule Take 40 mg by mouth 2 (two) times daily before a meal.     ezetimibe (ZETIA) 10 MG tablet Take 10 mg by mouth at bedtime.     fenofibrate 160 MG tablet Take 160 mg by mouth daily.     HYDROcodone-acetaminophen (NORCO) 10-325 MG per tablet Take 1 tablet by mouth 3 (three) times daily as needed for moderate  pain or severe pain. As needed     latanoprost (XALATAN) 0.005 % ophthalmic solution Place 1 drop into both eyes at bedtime.     lisinopril (PRINIVIL,ZESTRIL) 10 MG tablet Take 10 mg by mouth daily.     Multiple Vitamin (MULTIVITAMIN) tablet Take 1 tablet by mouth daily.     No current facility-administered medications for this visit.    Allergies as of 07/08/2022   (No Known Allergies)    Family History  Problem Relation Age of Onset   Lung cancer Mother    Hypertension Father    Prostate cancer Father    Hypertension Sister    Hypertension Brother    Hypertension Sister    Hypertension Sister    Hypertension Sister    Hypertension Brother    Hypertension Brother     Social History   Socioeconomic History   Marital status: Married    Spouse name: Not on file   Number of children: Not on file   Years of education: Not on file   Highest education level: Not on file  Occupational History   Not on file  Tobacco Use   Smoking status: Former    Packs/day: 1.00    Years: 20.00    Total pack years: 20.00    Types: Cigarettes    Quit date: 02/15/2003    Years since quitting: 19.4   Smokeless tobacco: Never   Tobacco comments:    quit smoking 12yrs ago  Vaping Use   Vaping Use: Never used  Substance and Sexual Activity   Alcohol use: Yes    Comment: occasionally beer   Drug use: No   Sexual activity: Not on file  Other Topics Concern   Not on file  Social History Narrative   Not on file   Social Determinants of Health   Financial Resource Strain: Not on file  Food Insecurity: Not on file  Transportation Needs: Not on file  Physical Activity: Not on file  Stress: Not on file  Social Connections: Not on file    Subjective: Review of Systems  Constitutional:  Negative for chills and fever.  HENT:  Negative for congestion and hearing loss.   Eyes:  Negative for blurred vision and double vision.  Respiratory:  Negative for cough and shortness of breath.    Cardiovascular:  Negative for chest pain and palpitations.  Gastrointestinal:  Positive for blood in stool, constipation and heartburn. Negative for abdominal pain, diarrhea, melena and vomiting.  Genitourinary:  Negative for dysuria and urgency.  Musculoskeletal:  Negative for joint pain and myalgias.  Skin:  Negative for itching and rash.  Neurological:  Negative for dizziness and headaches.  Psychiatric/Behavioral:  Negative for depression. The patient is not nervous/anxious.      Objective: BP 139/78 (BP Location: Right Arm, Patient Position: Sitting,   Cuff Size: Large)   Pulse 67   Temp (!) 97.3 F (36.3 C) (Temporal)   Ht 6' 2" (1.88 m)   Wt 216 lb (98 kg)   BMI 27.73 kg/m  Physical Exam Constitutional:      Appearance: Normal appearance.  HENT:     Head: Normocephalic and atraumatic.  Eyes:     Extraocular Movements: Extraocular movements intact.     Conjunctiva/sclera: Conjunctivae normal.  Cardiovascular:     Rate and Rhythm: Normal rate and regular rhythm.  Pulmonary:     Effort: Pulmonary effort is normal.     Breath sounds: Normal breath sounds.  Abdominal:     General: Bowel sounds are normal.     Palpations: Abdomen is soft.  Musculoskeletal:        General: Normal range of motion.     Cervical back: Normal range of motion and neck supple.  Skin:    General: Skin is warm.  Neurological:     General: No focal deficit present.     Mental Status: He is alert and oriented to person, place, and time.  Psychiatric:        Mood and Affect: Mood normal.        Behavior: Behavior normal.   Rectal exam deferred by patient until time of colonoscopy.   Assessment: *Chronic GERD-well-controlled *Chronic idiopathic constipation-mild *Rectal bleeding *Hemorrhoids  Plan: GERD well-controlled on Nexium twice daily.  Will continue.  For his rectal bleeding, Will schedule for colonoscopy.The risks including infection, bleed, or perforation as well as benefits,  limitations, alternatives and imponderables have been reviewed with the patient. Questions have been answered. All parties agreeable.  If no other identifiable cause of patient's bleeding found besides hemorrhoids, can consider hemorrhoid banding for treatment.  Handout given to patient today.  Counseled on constipation, continue stool softener as needed.  Avoid straining.  Avoid excessive time on toilet while having bowel movements.  Thank you Dr. Fusco for the kind referral.   07/08/2022 11:44 AM   Disclaimer: This note was dictated with voice recognition software. Similar sounding words can inadvertently be transcribed and may not be corrected upon review.  

## 2022-07-08 NOTE — H&P (View-Only) (Signed)
Referring Provider: Redmond School, MD Primary Care Physician:  Redmond School, MD Primary GI:  Dr. Abbey Chatters  Chief Complaint  Patient presents with   Rectal Bleeding    Patient here today due to having issues with rectal bleeding. He says the last time he saw any blood was 2-3 weeks ago. He has had issues with hemorrhoids in the past. No sight of dark stools. He does have occasional constipation and when he does he takes stools softeners prn. Patient denies any other current gi issues. He does report the eye doctor saw something on his eye exam that indicated he may have Polyps. Last Tcs was done by Dr. Arnoldo Morale in 2016. Jerrye Bushy is controlled with esomeprazole 40 mg bid.     HPI:   William Gill is a 67 y.o. male who presents to clinic today for follow-up visit.  History of chronic GERD well-controlled on Nexium twice daily.  Underwent EGD April 2022 for Barrett's screening.  GE junction biopsies showed acid reflux changes, negative for intestinal metaplasia.  Gastric biopsies with gastritis negative for H. pylori.  States his reflux is currently well-controlled on Nexium twice daily.  No dysphagia odynophagia.  No epigastric or chest pain.  Does have rectal bleeding.  States he has had issues for years with hemorrhoids though over the last 3 weeks he has had increased bright red blood per rectum, primarily with bowel movements.  Does note some mild constipation.  Symptoms intermittent.  Takes stool softeners as needed.  Occasional straining.  No rectal pain or discomfort.  Last colonoscopy 2016 with diverticulosis underlies unremarkable.  Performed by Dr. Arnoldo Morale.  States he recently had an eye exam and had findings which may indicate he has polyps.  No abdominal pain.  No family history of colorectal malignancy.  No unintentional weight loss.  Past Medical History:  Diagnosis Date   Arthritis    Chronic back pain    DDD (degenerative disc disease), cervical    GERD (gastroesophageal  reflux disease)    History of bronchitis winter 2014   Hyperlipidemia    Hypertension     Past Surgical History:  Procedure Laterality Date   ANKLE SURGERY Left    BACK SURGERY     microdiscectomy   BIOPSY  09/30/2020   Procedure: BIOPSY;  Surgeon: Eloise Harman, DO;  [facility]: [facility];  Service: Endoscopy;;   CARDIAC CATHETERIZATION     CERVICAL FUSION     COLONOSCOPY     COLONOSCOPY N/A 01/04/2015   Procedure: COLONOSCOPY;  Surgeon: Aviva Signs, MD;  [facility]: [facility];  Service: Gastroenterology;  Laterality: N/A;  730   ESOPHAGOGASTRODUODENOSCOPY (EGD) WITH PROPOFOL N/A 09/30/2020   Procedure: ESOPHAGOGASTRODUODENOSCOPY (EGD) WITH PROPOFOL;  Surgeon: Eloise Harman, DO;  [facility];  Service: Endoscopy;  Laterality: N/A;  am appt   SHOULDER ARTHROSCOPY Right    TONSILLECTOMY     TOTAL SHOULDER ARTHROPLASTY Right 02/22/2014   dr supple    TOTAL SHOULDER ARTHROPLASTY Right 02/22/2014   Procedure: RIGHT TOTAL SHOULDER ARTHROPLASTY;  Surgeon: Marin Shutter, MD;  Location: [place];  Service: Orthopedics;  Laterality: Right;    Current Outpatient Medications  Medication Sig Dispense Refill   amLODipine (NORVASC) 10 MG tablet Take 10 mg by mouth every morning.     atenolol (TENORMIN) 100 MG tablet TAKE 1 TABLET BY MOUTH TWICE  DAILY 180 tablet 3   atorvastatin (LIPITOR) 80 MG tablet Take 80 mg by mouth daily.  Coenzyme Q10 (CO Q 10) 100 MG CAPS Take 100 mg by mouth daily.     cyclobenzaprine (FLEXERIL) 10 MG tablet Take 1 tablet (10 mg total) by mouth 3 (three) times daily as needed for muscle spasms. 60 tablet 1   esomeprazole (NEXIUM) 40 MG capsule Take 40 mg by mouth 2 (two) times daily before a meal.     ezetimibe (ZETIA) 10 MG tablet Take 10 mg by mouth at bedtime.     fenofibrate 160 MG tablet Take 160 mg by mouth daily.     HYDROcodone-acetaminophen (NORCO) 10-325 MG per tablet Take 1 tablet by mouth 3 (three) times daily as needed for moderate  pain or severe pain. As needed     latanoprost (XALATAN) 0.005 % ophthalmic solution Place 1 drop into both eyes at bedtime.     lisinopril (PRINIVIL,ZESTRIL) 10 MG tablet Take 10 mg by mouth daily.     Multiple Vitamin (MULTIVITAMIN) tablet Take 1 tablet by mouth daily.     No current facility-administered medications for this visit.    Allergies as of 07/08/2022   (No Known Allergies)    Family History  Problem Relation Age of Onset   Lung cancer Mother    Hypertension Father    Prostate cancer Father    Hypertension Sister    Hypertension Brother    Hypertension Sister    Hypertension Sister    Hypertension Sister    Hypertension Brother    Hypertension Brother     Social History   Socioeconomic History   Marital status: Married    Spouse name: Not on file   Number of children: Not on file   Years of education: Not on file   Highest education level: Not on file  Occupational History   Not on file  Tobacco Use   Smoking status: Former    Packs/day: 1.00    Years: 20.00    Total pack years: 20.00    Types: Cigarettes    Quit date: 02/15/2003    Years since quitting: 19.4   Smokeless tobacco: Never   Tobacco comments:    quit smoking 86yr ago  Vaping Use   Vaping Use: Never used  Substance and Sexual Activity   Alcohol use: Yes    Comment: occasionally beer   Drug use: No   Sexual activity: Not on file  Other Topics Concern   Not on file  Social History Narrative   Not on file   Social Determinants of Health   Financial Resource Strain: Not on file  Food Insecurity: Not on file  Transportation Needs: Not on file  Physical Activity: Not on file  Stress: Not on file  Social Connections: Not on file    Subjective: Review of Systems  Constitutional:  Negative for chills and fever.  HENT:  Negative for congestion and hearing loss.   Eyes:  Negative for blurred vision and double vision.  Respiratory:  Negative for cough and shortness of breath.    Cardiovascular:  Negative for chest pain and palpitations.  Gastrointestinal:  Positive for blood in stool, constipation and heartburn. Negative for abdominal pain, diarrhea, melena and vomiting.  Genitourinary:  Negative for dysuria and urgency.  Musculoskeletal:  Negative for joint pain and myalgias.  Skin:  Negative for itching and rash.  Neurological:  Negative for dizziness and headaches.  Psychiatric/Behavioral:  Negative for depression. The patient is not nervous/anxious.      Objective: BP 139/78 (BP Location: Right Arm, Patient Position: Sitting,  Cuff Size: Large)   Pulse 67   Temp (!) 97.3 F (36.3 C) (Temporal)   Ht 6' 2"$  (1.88 m)   Wt 216 lb (98 kg)   BMI 27.73 kg/m  Physical Exam Constitutional:      Appearance: Normal appearance.  HENT:     Head: Normocephalic and atraumatic.  Eyes:     Extraocular Movements: Extraocular movements intact.     Conjunctiva/sclera: Conjunctivae normal.  Cardiovascular:     Rate and Rhythm: Normal rate and regular rhythm.  Pulmonary:     Effort: Pulmonary effort is normal.     Breath sounds: Normal breath sounds.  Abdominal:     General: Bowel sounds are normal.     Palpations: Abdomen is soft.  Musculoskeletal:        General: Normal range of motion.     Cervical back: Normal range of motion and neck supple.  Skin:    General: Skin is warm.  Neurological:     General: No focal deficit present.     Mental Status: He is alert and oriented to person, place, and time.  Psychiatric:        Mood and Affect: Mood normal.        Behavior: Behavior normal.   Rectal exam deferred by patient until time of colonoscopy.   Assessment: *Chronic GERD-well-controlled *Chronic idiopathic constipation-mild *Rectal bleeding *Hemorrhoids  Plan: GERD well-controlled on Nexium twice daily.  Will continue.  For his rectal bleeding, Will schedule for colonoscopy.The risks including infection, bleed, or perforation as well as benefits,  limitations, alternatives and imponderables have been reviewed with the patient. Questions have been answered. All parties agreeable.  If no other identifiable cause of patient's bleeding found besides hemorrhoids, can consider hemorrhoid banding for treatment.  Handout given to patient today.  Counseled on constipation, continue stool softener as needed.  Avoid straining.  Avoid excessive time on toilet while having bowel movements.  Thank you Dr. Gerarda Fraction for the kind referral.   07/08/2022 11:44 AM   Disclaimer: This note was dictated with voice recognition software. Similar sounding words can inadvertently be transcribed and may not be corrected upon review.

## 2022-07-10 DIAGNOSIS — M1991 Primary osteoarthritis, unspecified site: Secondary | ICD-10-CM | POA: Diagnosis not present

## 2022-07-10 DIAGNOSIS — I10 Essential (primary) hypertension: Secondary | ICD-10-CM | POA: Diagnosis not present

## 2022-07-10 DIAGNOSIS — M5136 Other intervertebral disc degeneration, lumbar region: Secondary | ICD-10-CM | POA: Diagnosis not present

## 2022-07-10 DIAGNOSIS — G894 Chronic pain syndrome: Secondary | ICD-10-CM | POA: Diagnosis not present

## 2022-08-04 ENCOUNTER — Encounter (HOSPITAL_COMMUNITY): Payer: Self-pay

## 2022-08-04 ENCOUNTER — Other Ambulatory Visit: Payer: Self-pay

## 2022-08-04 ENCOUNTER — Ambulatory Visit (HOSPITAL_COMMUNITY)
Admission: RE | Admit: 2022-08-04 | Discharge: 2022-08-04 | Disposition: A | Discharge status: Home / Self Care | Payer: Medicare Other | Source: Ambulatory Visit | Attending: Internal Medicine | Admitting: Internal Medicine

## 2022-08-04 ENCOUNTER — Ambulatory Visit (HOSPITAL_BASED_OUTPATIENT_CLINIC_OR_DEPARTMENT_OTHER): Payer: Medicare Other | Admitting: Anesthesiology

## 2022-08-04 ENCOUNTER — Ambulatory Visit (HOSPITAL_COMMUNITY): Payer: Medicare Other | Admitting: Anesthesiology

## 2022-08-04 ENCOUNTER — Encounter (HOSPITAL_COMMUNITY)
Admission: RE | Disposition: A | Discharge status: Home / Self Care | Payer: Self-pay | Source: Ambulatory Visit | Attending: Internal Medicine

## 2022-08-04 DIAGNOSIS — D123 Benign neoplasm of transverse colon: Secondary | ICD-10-CM

## 2022-08-04 DIAGNOSIS — Z8249 Family history of ischemic heart disease and other diseases of the circulatory system: Secondary | ICD-10-CM | POA: Insufficient documentation

## 2022-08-04 DIAGNOSIS — Z87891 Personal history of nicotine dependence: Secondary | ICD-10-CM | POA: Insufficient documentation

## 2022-08-04 DIAGNOSIS — I10 Essential (primary) hypertension: Secondary | ICD-10-CM | POA: Diagnosis not present

## 2022-08-04 DIAGNOSIS — Z79899 Other long term (current) drug therapy: Secondary | ICD-10-CM | POA: Diagnosis not present

## 2022-08-04 DIAGNOSIS — K625 Hemorrhage of anus and rectum: Secondary | ICD-10-CM | POA: Diagnosis not present

## 2022-08-04 DIAGNOSIS — K648 Other hemorrhoids: Secondary | ICD-10-CM

## 2022-08-04 DIAGNOSIS — K219 Gastro-esophageal reflux disease without esophagitis: Secondary | ICD-10-CM | POA: Insufficient documentation

## 2022-08-04 HISTORY — PX: COLONOSCOPY WITH PROPOFOL: SHX5780

## 2022-08-04 HISTORY — PX: POLYPECTOMY: SHX5525

## 2022-08-04 SURGERY — COLONOSCOPY WITH PROPOFOL
Anesthesia: General

## 2022-08-04 MED ORDER — PROPOFOL 10 MG/ML IV BOLUS
INTRAVENOUS | Status: DC | PRN
  Administered 2022-08-04: 100 mg via INTRAVENOUS

## 2022-08-04 MED ORDER — PROPOFOL 500 MG/50ML IV EMUL
INTRAVENOUS | Status: DC | PRN
  Administered 2022-08-04: 150 ug/kg/min via INTRAVENOUS

## 2022-08-04 MED ORDER — LIDOCAINE HCL 1 % IJ SOLN
INTRAMUSCULAR | Status: DC | PRN
  Administered 2022-08-04: 50 mg via INTRADERMAL

## 2022-08-04 MED ORDER — LACTATED RINGERS IV SOLN: INTRAVENOUS | Status: DC

## 2022-08-04 NOTE — Op Note (Signed)
[facility] Patient Name: William Gill Procedure Date: 08/04/2022 8:40 AM MRN: [id number] Date of Birth: 1956-03-25 Attending MD: Elon Alas. Abbey Chatters , [place], [id number] CSN: [id number] Age: 67 Admit Type: Outpatient Procedure:                Colonoscopy Indications:              Rectal bleeding Providers:                Elon Alas. Abbey Chatters, DO, Janeece Riggers, RN, Aram Candela Referring MD:              Medicines:                See the Anesthesia note for documentation of the                            administered medications Complications:            No immediate complications. Estimated Blood Loss:     Estimated blood loss was minimal. Procedure:                Pre-Anesthesia Assessment:                           - The anesthesia plan was to use monitored                            anesthesia care (MAC).                           After obtaining informed consent, the colonoscope                            was passed under direct vision. Throughout the                            procedure, the patient's blood pressure, pulse, and                            oxygen saturations were monitored continuously. The                            [id number]-[id number] scope was introduced through                            the anus and advanced to the the cecum, identified                            by appendiceal orifice and ileocecal valve. The                            colonoscopy was performed without difficulty. The                            patient tolerated the procedure well. The quality                            of the  bowel preparation was evaluated using the                            BBPS [facility] Bowel Preparation Scale) with scores                            of: Right Colon = 3, Transverse Colon = 3 and Left                            Colon = 3 (entire mucosa seen well with no residual                            staining, small fragments of stool or opaque                             liquid). The total BBPS score equals 9. Scope In: 8:53:37 AM Scope Out: 9:07:09 AM Scope Withdrawal Time: 0 hours 10 minutes 10 seconds  Total Procedure Duration: 0 hours 13 minutes 32 seconds  Findings:      The perianal and digital rectal examinations were normal.      Non-bleeding internal hemorrhoids were found during retroflexion.      A 6 mm polyp was found in the transverse colon. The polyp was sessile.       The polyp was removed with a cold snare. Resection and retrieval were       complete.      The exam was otherwise without abnormality. Impression:               - Non-bleeding internal hemorrhoids.                           - One 6 mm polyp in the transverse colon, removed                            with a cold snare. Resected and retrieved.                           - The examination was otherwise normal. Moderate Sedation:      Per Anesthesia Care Recommendation:           - Patient has a contact number available for                            emergencies. The signs and symptoms of potential                            delayed complications were discussed with the                            patient. Return to normal activities tomorrow.                            Written discharge instructions were provided to the  patient.                           - Resume previous diet.                           - Continue present medications.                           - Await pathology results.                           - Repeat colonoscopy in 5 years for surveillance.                           - Return to GI clinic at the next available                            appointment for hemorrhoid banding if bleeding                            continues. Procedure Code(s):        --- Professional ---                           [phone], Colonoscopy, flexible; with removal of                            tumor(s), polyp(s), or other lesion(s) by snare                             technique Diagnosis Code(s):        --- Professional ---                           D12.3, Benign neoplasm of transverse colon (hepatic                            flexure or splenic flexure)                           K64.8, Other hemorrhoids                           K62.5, Hemorrhage of anus and rectum CPT copyright 2022 American Medical Association. All rights reserved. The codes documented in this report are preliminary and upon coder review may  be revised to meet current compliance requirements. Elon Alas. Abbey Chatters, DO [place] Abbey Chatters, DO 08/04/2022 9:10:13 AM This report has been signed electronically. Number of Addenda: 0

## 2022-08-04 NOTE — Anesthesia Preprocedure Evaluation (Signed)
Anesthesia Evaluation  Patient identified by MRN, date of birth, ID band Patient awake    Reviewed: Allergy & Precautions, H&P , NPO status , Patient's Chart, lab work & pertinent test results, reviewed documented beta blocker date and time   Airway Mallampati: II  TM Distance: >3 FB Neck ROM: full    Dental no notable dental hx.    Pulmonary neg pulmonary ROS, former smoker   Pulmonary exam normal breath sounds clear to auscultation       Cardiovascular Exercise Tolerance: Good hypertension, negative cardio ROS  Rhythm:regular Rate:Normal     Neuro/Psych negative neurological ROS  negative psych ROS   GI/Hepatic negative GI ROS, Neg liver ROS,GERD  ,,  Endo/Other  negative endocrine ROS    Renal/GU negative Renal ROS  negative genitourinary   Musculoskeletal   Abdominal   Peds  Hematology negative hematology ROS (+)   Anesthesia Other Findings   Reproductive/Obstetrics negative OB ROS                             Anesthesia Physical Anesthesia Plan  ASA: 2  Anesthesia Plan: General   Post-op Pain Management:    Induction:   PONV Risk Score and Plan: Propofol infusion  Airway Management Planned:   Additional Equipment:   Intra-op Plan:   Post-operative Plan:   Informed Consent: I have reviewed the patients History and Physical, chart, labs and discussed the procedure including the risks, benefits and alternatives for the proposed anesthesia with the patient or authorized representative who has indicated his/her understanding and acceptance.     Dental Advisory Given  Plan Discussed with: CRNA  Anesthesia Plan Comments:        Anesthesia Quick Evaluation

## 2022-08-04 NOTE — Interval H&P Note (Signed)
History and Physical Interval Note:  08/04/2022 8:08 AM  William Gill  has presented today for surgery, with the diagnosis of RB.  The various methods of treatment have been discussed with the patient and family. After consideration of risks, benefits and other options for treatment, the patient has consented to  Procedure(s) with comments: COLONOSCOPY WITH PROPOFOL (N/A) - 930am, asa 2 as a surgical intervention.  The patient's history has been reviewed, patient examined, no change in status, stable for surgery.  I have reviewed the patient's chart and labs.  Questions were answered to the patient's satisfaction.     Eloise Harman

## 2022-08-04 NOTE — Anesthesia Postprocedure Evaluation (Signed)
Anesthesia Post Note  Patient: William Gill  Procedure(s) Performed: COLONOSCOPY WITH PROPOFOL POLYPECTOMY  Patient location during evaluation: Endoscopy Anesthesia Type: General Level of consciousness: awake Pain management: pain level controlled Vital Signs Assessment: post-procedure vital signs reviewed and stable Respiratory status: spontaneous breathing Cardiovascular status: blood pressure returned to baseline and stable Postop Assessment: no apparent nausea or vomiting Anesthetic complications: no   No notable events documented.   Last Vitals:  Vitals:   08/04/22 0806 08/04/22 0912  BP: 126/60 (!) 90/41  Pulse: 65 67  Resp: 14 15  Temp: 36.6 C 36.7 C  SpO2: 100% 97%    Last Pain:  Vitals:   08/04/22 0912  TempSrc: Axillary  PainSc: 0-No pain                 Paxson Harrower

## 2022-08-04 NOTE — Discharge Instructions (Addendum)
  Colonoscopy Discharge Instructions  Read the instructions outlined below and refer to this sheet in the next few weeks. These discharge instructions provide you with general information on caring for yourself after you leave the hospital. Your doctor may also give you specific instructions. While your treatment has been planned according to the most current medical practices available, unavoidable complications occasionally occur.   ACTIVITY You may resume your regular activity, but move at a slower pace for the next 24 hours.  Take frequent rest periods for the next 24 hours.  Walking will help get rid of the air and reduce the bloated feeling in your belly (abdomen).  No driving for 24 hours (because of the medicine (anesthesia) used during the test).   Do not sign any important legal documents or operate any machinery for 24 hours (because of the anesthesia used during the test).  NUTRITION Drink plenty of fluids.  You may resume your normal diet as instructed by your doctor.  Begin with a light meal and progress to your normal diet. Heavy or fried foods are harder to digest and may make you feel sick to your stomach (nauseated).  Avoid alcoholic beverages for 24 hours or as instructed.  MEDICATIONS You may resume your normal medications unless your doctor tells you otherwise.  WHAT YOU CAN EXPECT TODAY Some feelings of bloating in the abdomen.  Passage of more gas than usual.  Spotting of blood in your stool or on the toilet paper.  IF YOU HAD POLYPS REMOVED DURING THE COLONOSCOPY: No aspirin products for 7 days or as instructed.  No alcohol for 7 days or as instructed.  Eat a soft diet for the next 24 hours.  FINDING OUT THE RESULTS OF YOUR TEST Not all test results are available during your visit. If your test results are not back during the visit, make an appointment with your caregiver to find out the results. Do not assume everything is normal if you have not heard from your  caregiver or the medical facility. It is important for you to follow up on all of your test results.  SEEK IMMEDIATE MEDICAL ATTENTION IF: You have more than a spotting of blood in your stool.  Your belly is swollen (abdominal distention).  You are nauseated or vomiting.  You have a temperature over 101.  You have abdominal pain or discomfort that is severe or gets worse throughout the day.   Your colonoscopy revealed 1 polyp(s) which I removed successfully. Await pathology results, my office will contact you. I recommend repeating colonoscopy in 5 years for surveillance purposes.   You also have diverticulosis and internal hemorrhoids. I would recommend increasing fiber in your diet or adding OTC Benefiber/Metamucil. Be sure to drink at least 4 to 6 glasses of water daily.  The cause of your bleeding likely due to internal hemorrhoids.  We can set you up for banding to have these fixed if you are interested.  I hope you have a great rest of your week!  Elon Alas. Abbey Chatters, D.O. Gastroenterology and Hepatology [facility]

## 2022-08-04 NOTE — Transfer of Care (Signed)
Immediate Anesthesia Transfer of Care Note  Patient: William Gill  Procedure(s) Performed: COLONOSCOPY WITH PROPOFOL POLYPECTOMY  Patient Location: Endoscopy Unit  Anesthesia Type:General  Level of Consciousness: awake  Airway & Oxygen Therapy: Patient Spontanous Breathing  Post-op Assessment: Report given to RN  Post vital signs: Reviewed and stable  Last Vitals:  Vitals Value Taken Time  BP    Temp    Pulse    Resp    SpO2      Last Pain:  Vitals:   08/04/22 0806  TempSrc: Oral  PainSc: 0-No pain      Patients Stated Pain Goal: 5 ([id number])  Complications: No notable events documented.

## 2022-08-05 LAB — SURGICAL PATHOLOGY

## 2022-08-10 ENCOUNTER — Encounter (HOSPITAL_COMMUNITY): Payer: Self-pay | Admitting: Internal Medicine

## 2022-08-13 DIAGNOSIS — M1991 Primary osteoarthritis, unspecified site: Secondary | ICD-10-CM | POA: Diagnosis not present

## 2022-08-13 DIAGNOSIS — E7489 Other specified disorders of carbohydrate metabolism: Secondary | ICD-10-CM | POA: Diagnosis not present

## 2022-08-13 DIAGNOSIS — I10 Essential (primary) hypertension: Secondary | ICD-10-CM | POA: Diagnosis not present

## 2022-08-13 DIAGNOSIS — M5136 Other intervertebral disc degeneration, lumbar region: Secondary | ICD-10-CM | POA: Diagnosis not present

## 2022-08-13 DIAGNOSIS — G894 Chronic pain syndrome: Secondary | ICD-10-CM | POA: Diagnosis not present

## 2022-08-14 ENCOUNTER — Other Ambulatory Visit: Payer: Self-pay | Admitting: Cardiology

## 2022-08-27 ENCOUNTER — Ambulatory Visit (INDEPENDENT_AMBULATORY_CARE_PROVIDER_SITE_OTHER): Payer: Medicare Other | Admitting: Gastroenterology

## 2022-08-27 ENCOUNTER — Encounter: Payer: Self-pay | Admitting: Gastroenterology

## 2022-08-27 VITALS — BP 133/83 | HR 67 | Temp 97.1°F | Ht 74.0 in | Wt 215.5 lb

## 2022-08-27 DIAGNOSIS — K648 Other hemorrhoids: Secondary | ICD-10-CM

## 2022-08-27 NOTE — Progress Notes (Signed)
       [place] BANDING PROCEDURE NOTE  William Gill is a 67 y.o. male presenting today for consideration of hemorrhoid banding. Last colonoscopy Feb 2024 with non-bleeding internal hemorrhoids and one adenoma. Notes bleeding, itching, prolapse.    The patient presents with symptomatic grade 2 hemorrhoids, unresponsive to maximal medical therapy, requesting rubber band ligation of his/her hemorrhoidal disease. All risks, benefits, and alternative forms of therapy were described and informed consent was obtained.   The decision was made to band the left lateral internal hemorrhoid, and the [place] was used to perform band ligation without complication. Digital anorectal examination was then performed to assure proper positioning of the band, and to adjust the banded tissue as required. The patient was discharged home without pain or other issues. Dietary and behavioral recommendations were given, along with follow-up instructions. The patient will return in several weeks for followup and possible additional banding as required.  No complications were encountered and the patient tolerated the procedure well.   Annitta Needs, PhD, ANP-BC [facility]

## 2022-08-27 NOTE — Patient Instructions (Signed)
  Please avoid straining.  You should limit your toilet time to 2-3 minutes at the most.   I recommend Benefiber 2 teaspoons each morning in the beverage of your choice!  Please call me with any concerns or issues!  I will see you in follow-up for additional banding in several weeks.   It was a pleasure to see you today. I want to create trusting relationships with patients and provide genuine, compassionate, and quality care. I truly value your feedback, so please be on the lookout for a survey regarding your visit with me today. I appreciate your time in completing this!    Annitta Needs, PhD, ANP-BC [facility]

## 2022-09-10 ENCOUNTER — Ambulatory Visit: Payer: Medicare Other | Admitting: Gastroenterology

## 2022-09-10 ENCOUNTER — Encounter: Payer: Self-pay | Admitting: Gastroenterology

## 2022-09-10 VITALS — BP 131/73 | HR 62 | Temp 97.8°F | Ht 74.0 in | Wt 215.8 lb

## 2022-09-10 DIAGNOSIS — K648 Other hemorrhoids: Secondary | ICD-10-CM

## 2022-09-10 NOTE — Progress Notes (Signed)
    [place] BANDING PROCEDURE NOTE  William Gill is a 67 y.o. male presenting today for consideration of hemorrhoid banding. Last colonoscopy Feb 2024 with non-bleeding internal hemorrhoids and one adenoma. Notes bleeding, itching, prolapse. He has had left lateral banding thus far.    The patient presents with symptomatic grade 2 hemorrhoids, unresponsive to maximal medical therapy, requesting rubber band ligation of his hemorrhoidal disease. All risks, benefits, and alternative forms of therapy were described and informed consent was obtained.   The decision was made to band the right posterior internal hemorrhoid, and the [place] was used to perform band ligation without complication. Digital anorectal examination was then performed to assure proper positioning of the band, and to adjust the banded tissue as required. The patient was discharged home without pain or other issues. Dietary and behavioral recommendations were given, along with follow-up instructions. The patient will return in several weeks for followup and possible additional banding as required.  No complications were encountered and the patient tolerated the procedure well.   Annitta Needs, PhD, ANP-BC [facility]

## 2022-09-10 NOTE — Patient Instructions (Signed)
  Please avoid straining.  You should limit your toilet time to 2-3 minutes at the most.   I recommend Benefiber 2 teaspoons each morning in the beverage of your choice!  Please call me with any concerns or issues!  I will see you in follow-up for additional banding in several weeks.    I enjoyed seeing you again today! At our first visit, I mentioned how I value our relationship and want to provide genuine, compassionate, and quality care. You may receive a survey regarding your visit with me, and I welcome your feedback! Thanks so much for taking the time to complete this. I look forward to seeing you again.   Taviana Westergren W. Carnesha Maravilla, PhD, ANP-BC [facility]      

## 2022-09-11 DIAGNOSIS — E7489 Other specified disorders of carbohydrate metabolism: Secondary | ICD-10-CM | POA: Diagnosis not present

## 2022-09-11 DIAGNOSIS — M1991 Primary osteoarthritis, unspecified site: Secondary | ICD-10-CM | POA: Diagnosis not present

## 2022-09-11 DIAGNOSIS — M5136 Other intervertebral disc degeneration, lumbar region: Secondary | ICD-10-CM | POA: Diagnosis not present

## 2022-09-11 DIAGNOSIS — G9332 Myalgic encephalomyelitis/chronic fatigue syndrome: Secondary | ICD-10-CM | POA: Diagnosis not present

## 2022-09-11 DIAGNOSIS — G894 Chronic pain syndrome: Secondary | ICD-10-CM | POA: Diagnosis not present

## 2022-09-11 DIAGNOSIS — E782 Mixed hyperlipidemia: Secondary | ICD-10-CM | POA: Diagnosis not present

## 2022-09-11 DIAGNOSIS — E559 Vitamin D deficiency, unspecified: Secondary | ICD-10-CM | POA: Diagnosis not present

## 2022-09-11 DIAGNOSIS — Z0001 Encounter for general adult medical examination with abnormal findings: Secondary | ICD-10-CM | POA: Diagnosis not present

## 2022-09-11 DIAGNOSIS — I10 Essential (primary) hypertension: Secondary | ICD-10-CM | POA: Diagnosis not present

## 2022-09-11 DIAGNOSIS — D518 Other vitamin B12 deficiency anemias: Secondary | ICD-10-CM | POA: Diagnosis not present

## 2022-09-22 DIAGNOSIS — R7309 Other abnormal glucose: Secondary | ICD-10-CM | POA: Diagnosis not present

## 2022-10-13 ENCOUNTER — Encounter: Payer: Self-pay | Admitting: Gastroenterology

## 2022-10-13 ENCOUNTER — Ambulatory Visit: Payer: Medicare Other | Admitting: Gastroenterology

## 2022-10-13 VITALS — BP 129/73 | HR 58 | Temp 98.0°F | Ht 74.0 in | Wt 212.0 lb

## 2022-10-13 DIAGNOSIS — K648 Other hemorrhoids: Secondary | ICD-10-CM | POA: Diagnosis not present

## 2022-10-13 NOTE — Progress Notes (Signed)
    [facility] BANDING PROCEDURE NOTE  LADARIAN BONCZEK is a 67 y.o. male presenting today for consideration of hemorrhoid banding. Last colonoscopy Feb 2024 with non-bleeding internal hemorrhoids and one adenoma. Notes bleeding, itching, prolapse. He has had left lateral banding and right posterior banding thus far. Symptoms have improved. He has mild itching now.      The patient presents with symptomatic grade 2 hemorrhoids, unresponsive to maximal medical therapy, requesting rubber band ligation of his hemorrhoidal disease. All risks, benefits, and alternative forms of therapy were described and informed consent was obtained.  The decision was made to band the right anterior internal hemorrhoid, and the [facility] was used to perform band ligation without complication. Digital anorectal examination was then performed to assure proper positioning of the band, and to adjust the banded tissue as required. The patient was discharged home without pain or other issues. Dietary and behavioral recommendations were given, along with follow-up instructions. The patient will return as needed.   No complications were encountered and the patient tolerated the procedure well.   Gelene Mink, PhD, ANP-BC [facility]

## 2022-10-13 NOTE — Patient Instructions (Signed)
  Please avoid straining.  You should limit your toilet time to 2-3 minutes at the most.   I recommend Benefiber 2 teaspoons each morning in the beverage of your choice!  Please call me with any concerns or issues!  I will see you in follow-up as needed!  Your next colonoscopy will be in 2029!  I enjoyed seeing you again today! At our first visit, I mentioned how I value our relationship and want to provide genuine, compassionate, and quality care. You may receive a survey regarding your visit with me, and I welcome your feedback! Thanks so much for taking the time to complete this. I look forward to seeing you again.   Gelene Mink, PhD, ANP-BC [facility]

## 2022-10-15 DIAGNOSIS — Q141 Congenital malformation of retina: Secondary | ICD-10-CM | POA: Diagnosis not present

## 2022-10-15 DIAGNOSIS — H35371 Puckering of macula, right eye: Secondary | ICD-10-CM | POA: Diagnosis not present

## 2022-10-15 DIAGNOSIS — H26493 Other secondary cataract, bilateral: Secondary | ICD-10-CM | POA: Diagnosis not present

## 2022-10-15 DIAGNOSIS — H401131 Primary open-angle glaucoma, bilateral, mild stage: Secondary | ICD-10-CM | POA: Diagnosis not present

## 2022-10-15 DIAGNOSIS — H35033 Hypertensive retinopathy, bilateral: Secondary | ICD-10-CM | POA: Diagnosis not present

## 2022-11-04 DIAGNOSIS — I10 Essential (primary) hypertension: Secondary | ICD-10-CM | POA: Diagnosis not present

## 2022-11-04 DIAGNOSIS — M5136 Other intervertebral disc degeneration, lumbar region: Secondary | ICD-10-CM | POA: Diagnosis not present

## 2022-11-04 DIAGNOSIS — G894 Chronic pain syndrome: Secondary | ICD-10-CM | POA: Diagnosis not present

## 2022-11-04 DIAGNOSIS — M1991 Primary osteoarthritis, unspecified site: Secondary | ICD-10-CM | POA: Diagnosis not present

## 2023-01-25 DIAGNOSIS — I10 Essential (primary) hypertension: Secondary | ICD-10-CM | POA: Diagnosis not present

## 2023-01-25 DIAGNOSIS — G894 Chronic pain syndrome: Secondary | ICD-10-CM | POA: Diagnosis not present

## 2023-01-25 DIAGNOSIS — D485 Neoplasm of uncertain behavior of skin: Secondary | ICD-10-CM | POA: Diagnosis not present

## 2023-01-25 DIAGNOSIS — M1991 Primary osteoarthritis, unspecified site: Secondary | ICD-10-CM | POA: Diagnosis not present

## 2023-01-25 DIAGNOSIS — M5136 Other intervertebral disc degeneration, lumbar region: Secondary | ICD-10-CM | POA: Diagnosis not present

## 2023-02-05 ENCOUNTER — Ambulatory Visit: Payer: Medicare Other | Admitting: Podiatry

## 2023-02-05 DIAGNOSIS — M67471 Ganglion, right ankle and foot: Secondary | ICD-10-CM

## 2023-02-05 NOTE — Progress Notes (Signed)
Subjective:  Patient ID: William Gill, male    DOB: 12-11-1955,  MRN: [id number]  Chief Complaint  Patient presents with   Cyst    Pt stated that he has a cyst in between his toe  He stated it has been there for about 6 months no pain or discomfort with it     67 y.o. male presents with the above complaint.  Patient presents with right hallux ganglion cyst on the lateral aspect.  Patient states it does not bother him not causing him any pain and just wanted to get it evaluated make sure there is nothing concerning going on.  He just came out of nowhere last 6 months.  She will he would like to have it drained denies any other acute complaints  Review of Systems: Negative except as noted in the HPI. Denies N/V/F/Ch.  Past Medical History:  Diagnosis Date   Arthritis    Chronic back pain    DDD (degenerative disc disease), cervical    GERD (gastroesophageal reflux disease)    History of bronchitis winter 2014   Hyperlipidemia    Hypertension     Current Outpatient Medications:    amLODipine (NORVASC) 10 MG tablet, Take 10 mg by mouth every morning., Disp: , Rfl:    atenolol (TENORMIN) 100 MG tablet, TAKE 1 TABLET BY MOUTH TWICE A DAY, Disp: 180 tablet, Rfl: 2   atorvastatin (LIPITOR) 80 MG tablet, Take 80 mg by mouth daily., Disp: , Rfl:    Coenzyme Q10 (CO Q 10) 100 MG CAPS, Take 100 mg by mouth daily., Disp: , Rfl:    cyclobenzaprine (FLEXERIL) 10 MG tablet, Take 1 tablet (10 mg total) by mouth 3 (three) times daily as needed for muscle spasms., Disp: 60 tablet, Rfl: 1   esomeprazole (NEXIUM) 40 MG capsule, Take 40 mg by mouth 2 (two) times daily before a meal., Disp: , Rfl:    ezetimibe (ZETIA) 10 MG tablet, Take 10 mg by mouth at bedtime., Disp: , Rfl:    fenofibrate 160 MG tablet, Take 160 mg by mouth daily., Disp: , Rfl:    HYDROcodone-acetaminophen (NORCO) 10-325 MG per tablet, Take 1 tablet by mouth every 8 (eight) hours as needed for moderate pain or severe pain. As  needed, Disp: , Rfl:    latanoprost (XALATAN) 0.005 % ophthalmic solution, Place 1 drop into both eyes at bedtime., Disp: , Rfl:    lisinopril (PRINIVIL,ZESTRIL) 10 MG tablet, Take 10 mg by mouth daily., Disp: , Rfl:    Multiple Vitamin (MULTIVITAMIN) tablet, Take 1 tablet by mouth daily., Disp: , Rfl:    Vitamin D, Ergocalciferol, (DRISDOL) 1.25 MG (50000 UNIT) CAPS capsule, Take 50,000 Units by mouth once a week., Disp: , Rfl:   Social History   Tobacco Use  Smoking Status Former   Current packs/day: 0.00   Average packs/day: 1 pack/day for 20.0 years (20.0 ttl pk-yrs)   Types: Cigarettes   Start date: 02/15/1983   Quit date: 02/15/2003   Years since quitting: 19.9  Smokeless Tobacco Never  Tobacco Comments   quit smoking 67yrs ago    No Known Allergies Objective:  There were no vitals filed for this visit. There is no height or weight on file to calculate BMI. Constitutional Well developed. Well nourished.  Vascular Dorsalis pedis pulses palpable bilaterally. Posterior tibial pulses palpable bilaterally. Capillary refill normal to all digits.  No cyanosis or clubbing noted. Pedal hair growth normal.  Neurologic Normal speech. Oriented to person, place,  and time. Epicritic sensation to light touch grossly present bilaterally.  Dermatologic Nails well groomed and normal in appearance. No open wounds. No skin lesions.  Orthopedic: Right hallux lateral side ganglion cyst with a fluid-filled sac.  Does not appear to be indurated.  Appears to be coming from the metatarsophalangeal joint of the first.  Gelatinous material clinically appreciated   Radiographs: None Assessment:   1. Ganglion cyst of right foot    Plan:  Patient was evaluated and treated and all questions answered.  Right hallux ganglion cyst -All questions and concerns were discussed with the patient extensive detail.  Given the amount of cyst is present he will benefit from drainage of the cyst. Excision of  the skin lesion Skin was prepped in standard technique with Betadine.  One-to-one mixture of 1% lidocaine plain half percent Marcaine plain was injected circumferentially in V-block fashion 3 cc.  Using 18-gauge hide all the lesion was drained gelatinous material noted.  1 cc of fluid was drained.  Reduction of growth noted.   No follow-ups on file.

## 2023-02-07 ENCOUNTER — Other Ambulatory Visit: Payer: Self-pay | Admitting: Cardiology

## 2023-03-01 ENCOUNTER — Encounter (HOSPITAL_COMMUNITY): Payer: Self-pay | Admitting: Emergency Medicine

## 2023-03-01 ENCOUNTER — Other Ambulatory Visit: Payer: Self-pay

## 2023-03-01 ENCOUNTER — Emergency Department (HOSPITAL_COMMUNITY)
Admission: EM | Admit: 2023-03-01 | Discharge: 2023-03-02 | Disposition: A | Discharge status: Home / Self Care | Payer: Medicare Other | Attending: Emergency Medicine | Admitting: Emergency Medicine

## 2023-03-01 DIAGNOSIS — M7989 Other specified soft tissue disorders: Secondary | ICD-10-CM | POA: Diagnosis not present

## 2023-03-01 DIAGNOSIS — M79605 Pain in left leg: Secondary | ICD-10-CM | POA: Insufficient documentation

## 2023-03-01 DIAGNOSIS — R600 Localized edema: Secondary | ICD-10-CM | POA: Diagnosis not present

## 2023-03-01 LAB — BASIC METABOLIC PANEL
Anion gap: 11 (ref 5–15)
BUN: 24 mg/dL — ABNORMAL HIGH (ref 8–23)
CO2: 24 mmol/L (ref 22–32)
Calcium: 9.3 mg/dL (ref 8.9–10.3)
Chloride: 101 mmol/L (ref 98–111)
Creatinine, Ser: 1.34 mg/dL — ABNORMAL HIGH (ref 0.61–1.24)
GFR, Estimated: 59 mL/min — ABNORMAL LOW (ref >60)
Glucose, Bld: 98 mg/dL (ref 70–99)
Potassium: 3.9 mmol/L (ref 3.5–5.1)
Sodium: 136 mmol/L (ref 135–145)

## 2023-03-01 LAB — CBC WITH DIFFERENTIAL/PLATELET
Abs Immature Granulocytes: 0.03 10*3/uL (ref 0.00–0.07)
Basophils Absolute: 0.1 10*3/uL (ref 0.0–0.1)
Basophils Relative: 1 %
Eosinophils Absolute: 0.4 10*3/uL (ref 0.0–0.5)
Eosinophils Relative: 5 %
HCT: 38.7 % — ABNORMAL LOW (ref 39.0–52.0)
Hemoglobin: 12.8 g/dL — ABNORMAL LOW (ref 13.0–17.0)
Immature Granulocytes: 0 %
Lymphocytes Relative: 30 %
Lymphs Abs: 2.5 10*3/uL (ref 0.7–4.0)
MCH: 30.2 pg (ref 26.0–34.0)
MCHC: 33.1 g/dL (ref 30.0–36.0)
MCV: 91.3 fL (ref 80.0–100.0)
Monocytes Absolute: 0.9 10*3/uL (ref 0.1–1.0)
Monocytes Relative: 11 %
Neutro Abs: 4.5 10*3/uL (ref 1.7–7.7)
Neutrophils Relative %: 53 %
Platelets: 362 10*3/uL (ref 150–400)
RBC: 4.24 MIL/uL (ref 4.22–5.81)
RDW: 13.6 % (ref 11.5–15.5)
WBC: 8.5 10*3/uL (ref 4.0–10.5)
nRBC: 0 % (ref 0.0–0.2)

## 2023-03-01 NOTE — ED Triage Notes (Signed)
Pt presents with pain behind left knee, radiating up to left thigh, burning sensation, here to r/o DVT.

## 2023-03-02 MED ORDER — ENOXAPARIN SODIUM 100 MG/ML IJ SOSY
1.0000 mg/kg | PREFILLED_SYRINGE | Freq: Once | INTRAMUSCULAR | Status: AC
  Administered 2023-03-02: 97.5 mg via SUBCUTANEOUS

## 2023-03-02 NOTE — ED Provider Notes (Signed)
[place] EMERGENCY DEPARTMENT AT [facility]  Provider Note  CSN: [id number] Arrival date & time: 03/01/23 1945  History Chief Complaint  Patient presents with   Leg Pain    William Gill is a 67 y.o. male with here with 4-5 days of L leg pain and swelling, started behind L knee and spread to calf, some swelling distally. Had some recent travel and became concerned about DVT. No CP or SOB.    Home Medications Prior to Admission medications   Medication Sig Start Date End Date Taking? Authorizing Provider  amLODipine (NORVASC) 10 MG tablet Take 10 mg by mouth every morning.    [provider]  atenolol (TENORMIN) 100 MG tablet TAKE 1 TABLET BY MOUTH TWICE A DAY 02/08/23   Jonelle Sidle, MD  atorvastatin (LIPITOR) 80 MG tablet Take 80 mg by mouth daily.    [provider]  Coenzyme Q10 (CO Q 10) 100 MG CAPS Take 100 mg by mouth daily.    [provider]  cyclobenzaprine (FLEXERIL) 10 MG tablet Take 1 tablet (10 mg total) by mouth 3 (three) times daily as needed for muscle spasms. 03/05/15   Coletta Memos, MD  esomeprazole (NEXIUM) 40 MG capsule Take 40 mg by mouth 2 (two) times daily before a meal.    [provider]  ezetimibe (ZETIA) 10 MG tablet Take 10 mg by mouth at bedtime.    [provider]  fenofibrate 160 MG tablet Take 160 mg by mouth daily. 06/12/20   [provider]  HYDROcodone-acetaminophen (NORCO) 10-325 MG per tablet Take 1 tablet by mouth every 8 (eight) hours as needed for moderate pain or severe pain. As needed    [provider]  latanoprost (XALATAN) 0.005 % ophthalmic solution Place 1 drop into both eyes at bedtime. 07/18/21   [provider]  lisinopril (PRINIVIL,ZESTRIL) 10 MG tablet Take 10 mg by mouth daily.    [provider]  Multiple Vitamin (MULTIVITAMIN) tablet Take 1 tablet by mouth daily.    [provider]  Vitamin D, Ergocalciferol, (DRISDOL) 1.25 MG  (50000 UNIT) CAPS capsule Take 50,000 Units by mouth once a week. 09/20/22   [provider]     Allergies    Patient has no known allergies.   Review of Systems   Review of Systems Please see HPI for pertinent positives and negatives  Physical Exam BP 130/71   Pulse 60   Temp 98.7 F (37.1 C) (Oral)   Resp 17   Ht 6\' 2"  (1.88 m)   Wt 97.5 kg   SpO2 98%   BMI 27.60 kg/m   Physical Exam Vitals and nursing note reviewed.  Constitutional:      Appearance: Normal appearance.  HENT:     Head: Normocephalic and atraumatic.     Nose: Nose normal.     Mouth/Throat:     Mouth: Mucous membranes are moist.  Eyes:     Extraocular Movements: Extraocular movements intact.     Conjunctiva/sclera: Conjunctivae normal.  Cardiovascular:     Rate and Rhythm: Normal rate.  Pulmonary:     Effort: Pulmonary effort is normal.     Breath sounds: Normal breath sounds.  Abdominal:     General: Abdomen is flat.     Palpations: Abdomen is soft.     Tenderness: There is no abdominal tenderness.  Musculoskeletal:        General: Tenderness (mild L popliteal fossa) present. No swelling. Normal  range of motion.     Cervical back: Neck supple.     Right lower leg: No edema.     Left lower leg: Edema present.  Skin:    General: Skin is warm and dry.  Neurological:     General: No focal deficit present.     Mental Status: He is alert.  Psychiatric:        Mood and Affect: Mood normal.     ED Results / Procedures / Treatments   EKG None  Procedures Procedures  Medications Ordered in the ED Medications  enoxaparin (LOVENOX) injection 97.5 mg (has no administration in time range)    Initial Impression and Plan  Patient here with LLE pain and swelling, consider DVT vs baker's cyst. Unfortunately [place] is not available at this hour. Will give a dose of Lovenox and have him return in the AM for [place]. CBC is unremarkable. BMP with mild CKD, about at baseline.   ED Course        MDM Rules/Calculators/A&P Medical Decision Making Problems Addressed: Left leg pain: acute illness or injury  Amount and/or Complexity of Data Reviewed Labs: ordered. Decision-making details documented in ED Course. Radiology: ordered.  Risk Prescription drug management.     Final Clinical Impression(s) / ED Diagnoses Final diagnoses:  Left leg pain    Rx / DC Orders ED Discharge Orders          Ordered    Lower Ext Left Venous US       Comments: IMPORTANT PATIENT INSTRUCTIONS:  Your ED provider has recommended an Outpatient Ultrasound.  Please call [phone] to schedule an appointment.  If your appointment is scheduled for a Saturday, Sunday or holiday, please go to the [facility] Emergency Department Registration Desk at least 15 minutes prior to your appointment time and tell them you are there for an ultrasound.    If your appointment is scheduled for a weekday (Monday-Friday), please go directly to the [facility] Radiology Department at least 15 minutes prior to your appointment time and tell them you are there for an ultrasound.  Please call [phone] with questions.   03/02/23 0159             Pollyann Savoy, MD 03/02/23 [phone]

## 2023-03-03 ENCOUNTER — Ambulatory Visit (HOSPITAL_COMMUNITY)
Admission: RE | Admit: 2023-03-03 | Discharge: 2023-03-03 | Disposition: A | Discharge status: Home / Self Care | Payer: Medicare Other | Source: Ambulatory Visit | Attending: Emergency Medicine | Admitting: Emergency Medicine

## 2023-03-03 DIAGNOSIS — M7122 Synovial cyst of popliteal space [Baker], left knee: Secondary | ICD-10-CM | POA: Insufficient documentation

## 2023-03-03 DIAGNOSIS — M79605 Pain in left leg: Secondary | ICD-10-CM | POA: Insufficient documentation

## 2023-03-03 NOTE — ED Provider Notes (Signed)
Here for follow-up ultrasound to left leg, seen yesterday for swelling and pain.  Was given Lovenox and advised to come back today for ultrasound.  Was read by radiology.  No DVT but patient does have a mildly complex Baker's cyst.  This is likely the cause of this pain in his popliteal area.  He is advised to follow-up with PCP/orthopedics and come back to the ER for new or worsening symptoms.   William Gill 03/03/23 1448    Durwin Glaze, MD 03/04/23 1150

## 2023-03-22 DIAGNOSIS — G894 Chronic pain syndrome: Secondary | ICD-10-CM | POA: Diagnosis not present

## 2023-03-22 DIAGNOSIS — M62830 Muscle spasm of back: Secondary | ICD-10-CM | POA: Diagnosis not present

## 2023-03-22 DIAGNOSIS — M1991 Primary osteoarthritis, unspecified site: Secondary | ICD-10-CM | POA: Diagnosis not present

## 2023-03-22 DIAGNOSIS — M5136 Other intervertebral disc degeneration, lumbar region: Secondary | ICD-10-CM | POA: Diagnosis not present

## 2023-03-22 DIAGNOSIS — M545 Low back pain, unspecified: Secondary | ICD-10-CM | POA: Diagnosis not present

## 2023-03-22 DIAGNOSIS — I10 Essential (primary) hypertension: Secondary | ICD-10-CM | POA: Diagnosis not present

## 2023-03-22 DIAGNOSIS — E7489 Other specified disorders of carbohydrate metabolism: Secondary | ICD-10-CM | POA: Diagnosis not present

## 2023-04-02 DIAGNOSIS — H401131 Primary open-angle glaucoma, bilateral, mild stage: Secondary | ICD-10-CM | POA: Diagnosis not present

## 2023-04-09 DIAGNOSIS — Z23 Encounter for immunization: Secondary | ICD-10-CM | POA: Diagnosis not present

## 2023-05-04 DIAGNOSIS — I10 Essential (primary) hypertension: Secondary | ICD-10-CM | POA: Diagnosis not present

## 2023-05-04 DIAGNOSIS — M5134 Other intervertebral disc degeneration, thoracic region: Secondary | ICD-10-CM | POA: Diagnosis not present

## 2023-05-04 DIAGNOSIS — M1991 Primary osteoarthritis, unspecified site: Secondary | ICD-10-CM | POA: Diagnosis not present

## 2023-05-04 DIAGNOSIS — G894 Chronic pain syndrome: Secondary | ICD-10-CM | POA: Diagnosis not present

## 2023-05-27 ENCOUNTER — Other Ambulatory Visit: Payer: Self-pay

## 2023-05-27 ENCOUNTER — Encounter: Payer: Self-pay | Admitting: Emergency Medicine

## 2023-05-27 ENCOUNTER — Emergency Department
Admission: EM | Admit: 2023-05-27 | Discharge: 2023-05-27 | Discharge status: Against Medical Advice | Payer: Medicare Other | Attending: Emergency Medicine | Admitting: Emergency Medicine

## 2023-05-27 DIAGNOSIS — Z5321 Procedure and treatment not carried out due to patient leaving prior to being seen by health care provider: Secondary | ICD-10-CM | POA: Insufficient documentation

## 2023-05-27 DIAGNOSIS — R059 Cough, unspecified: Secondary | ICD-10-CM | POA: Diagnosis not present

## 2023-05-27 NOTE — ED Notes (Signed)
Pt ambulatory to desk, states that he went to restroom and coughed up a large piece of lettuce and now feels much better, no longer wishes to be seen.

## 2023-05-27 NOTE — ED Triage Notes (Signed)
Pt to ED via POV. Pt states that about 1 hour ago he had a nicotine pouch in his mouth, pt started coughing and thinks that he may have swallowed the pouch. Pt states that he feels like it is stuck in his throat. Pt is in NAD but is coughing and clearing his throat a lot.

## 2023-07-02 DIAGNOSIS — I10 Essential (primary) hypertension: Secondary | ICD-10-CM | POA: Diagnosis not present

## 2023-07-02 DIAGNOSIS — M5134 Other intervertebral disc degeneration, thoracic region: Secondary | ICD-10-CM | POA: Diagnosis not present

## 2023-07-02 DIAGNOSIS — K1120 Sialoadenitis, unspecified: Secondary | ICD-10-CM | POA: Diagnosis not present

## 2023-07-02 DIAGNOSIS — E7489 Other specified disorders of carbohydrate metabolism: Secondary | ICD-10-CM | POA: Diagnosis not present

## 2023-07-02 DIAGNOSIS — G894 Chronic pain syndrome: Secondary | ICD-10-CM | POA: Diagnosis not present

## 2023-07-02 DIAGNOSIS — M1991 Primary osteoarthritis, unspecified site: Secondary | ICD-10-CM | POA: Diagnosis not present

## 2023-07-08 DIAGNOSIS — K1120 Sialoadenitis, unspecified: Secondary | ICD-10-CM | POA: Diagnosis not present

## 2023-07-08 DIAGNOSIS — G894 Chronic pain syndrome: Secondary | ICD-10-CM | POA: Diagnosis not present

## 2023-07-08 DIAGNOSIS — M1991 Primary osteoarthritis, unspecified site: Secondary | ICD-10-CM | POA: Diagnosis not present

## 2023-07-08 DIAGNOSIS — I10 Essential (primary) hypertension: Secondary | ICD-10-CM | POA: Diagnosis not present

## 2023-07-14 DIAGNOSIS — M5134 Other intervertebral disc degeneration, thoracic region: Secondary | ICD-10-CM | POA: Diagnosis not present

## 2023-07-14 DIAGNOSIS — I10 Essential (primary) hypertension: Secondary | ICD-10-CM | POA: Diagnosis not present

## 2023-07-14 DIAGNOSIS — K1120 Sialoadenitis, unspecified: Secondary | ICD-10-CM | POA: Diagnosis not present

## 2023-07-14 DIAGNOSIS — G894 Chronic pain syndrome: Secondary | ICD-10-CM | POA: Diagnosis not present

## 2023-07-30 ENCOUNTER — Other Ambulatory Visit: Payer: Self-pay | Admitting: Cardiology

## 2023-08-03 ENCOUNTER — Other Ambulatory Visit: Payer: Self-pay | Admitting: Otolaryngology

## 2023-08-03 DIAGNOSIS — R220 Localized swelling, mass and lump, head: Secondary | ICD-10-CM | POA: Diagnosis not present

## 2023-08-03 DIAGNOSIS — K1123 Chronic sialoadenitis: Secondary | ICD-10-CM | POA: Diagnosis not present

## 2023-08-10 ENCOUNTER — Ambulatory Visit
Admission: RE | Admit: 2023-08-10 | Discharge: 2023-08-10 | Disposition: A | Discharge status: Home / Self Care | Payer: Medicare Other | Source: Ambulatory Visit | Attending: Otolaryngology | Admitting: Otolaryngology

## 2023-08-10 DIAGNOSIS — R220 Localized swelling, mass and lump, head: Secondary | ICD-10-CM | POA: Diagnosis not present

## 2023-08-10 DIAGNOSIS — K1123 Chronic sialoadenitis: Secondary | ICD-10-CM | POA: Diagnosis not present

## 2023-08-10 MED ORDER — IOPAMIDOL (ISOVUE-300) INJECTION 61%
75.0000 mL | Freq: Once | INTRAVENOUS | Status: AC | PRN
  Administered 2023-08-10: 75 mL via INTRAVENOUS

## 2023-08-19 DIAGNOSIS — K090 Developmental odontogenic cysts: Secondary | ICD-10-CM | POA: Diagnosis not present

## 2023-08-23 DIAGNOSIS — G894 Chronic pain syndrome: Secondary | ICD-10-CM | POA: Diagnosis not present

## 2023-08-23 DIAGNOSIS — R220 Localized swelling, mass and lump, head: Secondary | ICD-10-CM | POA: Diagnosis not present

## 2023-08-23 DIAGNOSIS — M5134 Other intervertebral disc degeneration, thoracic region: Secondary | ICD-10-CM | POA: Diagnosis not present

## 2023-08-26 ENCOUNTER — Other Ambulatory Visit: Payer: Self-pay | Admitting: Cardiology

## 2023-09-05 ENCOUNTER — Other Ambulatory Visit: Payer: Self-pay | Admitting: Cardiology

## 2023-09-09 ENCOUNTER — Other Ambulatory Visit: Payer: Self-pay | Admitting: Cardiology

## 2023-09-20 ENCOUNTER — Other Ambulatory Visit: Payer: Self-pay

## 2023-09-20 ENCOUNTER — Encounter (HOSPITAL_BASED_OUTPATIENT_CLINIC_OR_DEPARTMENT_OTHER): Payer: Self-pay | Admitting: Oral Surgery

## 2023-09-23 ENCOUNTER — Ambulatory Visit: Payer: Self-pay | Admitting: Oral Surgery

## 2023-09-23 ENCOUNTER — Encounter (HOSPITAL_BASED_OUTPATIENT_CLINIC_OR_DEPARTMENT_OTHER)
Admission: RE | Admit: 2023-09-23 | Discharge: 2023-09-23 | Disposition: A | Discharge status: Home / Self Care | Source: Ambulatory Visit | Attending: Oral Surgery | Admitting: Oral Surgery

## 2023-09-23 DIAGNOSIS — Z01812 Encounter for preprocedural laboratory examination: Secondary | ICD-10-CM | POA: Insufficient documentation

## 2023-09-23 DIAGNOSIS — M5134 Other intervertebral disc degeneration, thoracic region: Secondary | ICD-10-CM | POA: Diagnosis not present

## 2023-09-23 DIAGNOSIS — G894 Chronic pain syndrome: Secondary | ICD-10-CM | POA: Diagnosis not present

## 2023-09-23 DIAGNOSIS — M7122 Synovial cyst of popliteal space [Baker], left knee: Secondary | ICD-10-CM | POA: Diagnosis not present

## 2023-09-23 DIAGNOSIS — K090 Developmental odontogenic cysts: Secondary | ICD-10-CM | POA: Diagnosis not present

## 2023-09-23 NOTE — H&P (View-Only) (Signed)
 William Gill is an 68 y.o. male.   Chief Complaint: right mandibular cyst HPI: Patient is a  68 -year-old male referred by William [facility] with [facility] for evaluation a largedentegerous cyst  . Patient was seen by general dentist William Gill and pt states William Gill did not see anything and the pt needed a crown. Pt states its been draining and hurting and pt made appt to see ENT William Gill. CBCT images were taken and a large right mandibular lesion was seen on CBCT.  Patient concerned about pain and drainage from the area. Currently symptomatic.  Past Medical History:  Diagnosis Date   Arthritis    Chronic back pain    DDD (degenerative disc disease), cervical    GERD (gastroesophageal reflux disease)    History of bronchitis winter 2014   Hyperlipidemia    Hypertension     Past Surgical History:  Procedure Laterality Date   ANKLE SURGERY Left    BACK SURGERY     microdiscectomy   BIOPSY  09/30/2020   Procedure: BIOPSY;  Surgeon: William Gill;  [facility];  Service: Endoscopy;;   CARDIAC CATHETERIZATION     CERVICAL FUSION     COLONOSCOPY     COLONOSCOPY N/A 01/04/2015   Procedure: COLONOSCOPY;  Surgeon: William Gill;  [facility]: [facility];  Service: Gastroenterology;  Laterality: N/A;  730   COLONOSCOPY WITH PROPOFOL N/A 08/04/2022   Procedure: COLONOSCOPY WITH PROPOFOL;  Surgeon: William Gill;  [facility]: [facility];  Service: Endoscopy;  Laterality: N/A;  930am, asa 2   ESOPHAGOGASTRODUODENOSCOPY (EGD) WITH PROPOFOL N/A 09/30/2020   Procedure: ESOPHAGOGASTRODUODENOSCOPY (EGD) WITH PROPOFOL;  Surgeon: William Gill;  [facility];  Service: Endoscopy;  Laterality: N/A;  am appt   POLYPECTOMY  08/04/2022   Procedure: POLYPECTOMY;  Surgeon: William Gill;  [facility];  Service: Endoscopy;;   SHOULDER ARTHROSCOPY Right    TONSILLECTOMY     TOTAL SHOULDER ARTHROPLASTY Right 02/22/2014   dr supple    TOTAL  SHOULDER ARTHROPLASTY Right 02/22/2014   Procedure: RIGHT TOTAL SHOULDER ARTHROPLASTY;  Surgeon: William Gill;  Location: [facility];  Service: Orthopedics;  Laterality: Right;    Family History  Problem Relation Age of Onset   Lung cancer Mother    Hypertension Father    Prostate cancer Father    Hypertension Sister    Hypertension Brother    Hypertension Sister    Hypertension Sister    Hypertension Sister    Hypertension Brother    Hypertension Brother    Social History:  reports that he quit smoking about 20 years ago. His smoking use included cigarettes. He started smoking about 40 years ago. He has a 20 pack-year smoking history. He has never used smokeless tobacco. He reports current alcohol use. He reports that he does not use drugs.  Allergies: No Known Allergies  ROS: other than HPI, neg.  Vitals: 98kg, 72"; 63, 158/85, 16, 98% Exam: No cervical lymphadenopathy. Tooth #31 is TTP. There is a draining sinus tract on the distal alveolar ridge with noted purulence. No paresthesia. oropharynx clear. Pre-anes vitals obtained.  Lungs: CTA-B.  Heart: RRR, nl S1,S2.   Abd: s,nt  Radiograph was reviewed -  CBCT in sidexis 09/02/2023: there is a large 4 x 2.5 x 1.2cm, unilocular, well-circumstribed radiolucent lesion in the right ramus/angle; also involving the roots of tooth #31 with no apparent root  resorption. Cortical thinning noted with expansion. IAN canal space appears displaced near the inferior border with no evidence of compression.    Assessment: DDx to include dentigerous cyst, OKC, Ameloblastoma  Discussed the diagnosis, planned procedure, alternative treatments with the pt to include no treatment as well as the benefits, potential risks and complications with the patient.  The possible complications discussed and include but are not limited to:  Pain, swelling, bleeding, infection, damage to adjacent structures, numbness (nerve damage) to the lip/chin/tongue,  complications of anesthesia and possible need for further surgery.  The patient was given an opportunity to ask questions. General anesthesia at [facility] was recommended for the procedure. Written preoperative instructions were given. The consent was reviewed.  Plan: recommend enucleation and curretage of right mandibular ramus/angle lesion with bone graft and membrane and extraction of #31. Procedure will be preformed at the [facility].  Anesthesia Request: nasal intubation.  William Gill, DMD 09/23/2023, 8:35 AM

## 2023-09-23 NOTE — H&P (Addendum)
 William Gill is an 68 y.o. male.   Chief Complaint: right mandibular cyst HPI: Patient is a  68 -year-old male referred by Dr. Ernestene Kiel with [facility] for evaluation a largedentegerous cyst  . Patient was seen by general dentist Dr. Modesto Charon and pt states Dr. Modesto Charon did not see anything and the pt needed a crown. Pt states its been draining and hurting and pt made appt to see ENT Dr. Ernestene Kiel. CBCT images were taken and a large right mandibular lesion was seen on CBCT.  Patient concerned about pain and drainage from the area. Currently symptomatic.  Past Medical History:  Diagnosis Date   Arthritis    Chronic back pain    DDD (degenerative disc disease), cervical    GERD (gastroesophageal reflux disease)    History of bronchitis winter 2014   Hyperlipidemia    Hypertension     Past Surgical History:  Procedure Laterality Date   ANKLE SURGERY Left    BACK SURGERY     microdiscectomy   BIOPSY  09/30/2020   Procedure: BIOPSY;  Surgeon: Lanelle Bal, DO;  [facility];  Service: Endoscopy;;   CARDIAC CATHETERIZATION     CERVICAL FUSION     COLONOSCOPY     COLONOSCOPY N/A 01/04/2015   Procedure: COLONOSCOPY;  Surgeon: Franky Macho, MD;  [facility];  Service: Gastroenterology;  Laterality: N/A;  730   COLONOSCOPY WITH PROPOFOL N/A 08/04/2022   Procedure: COLONOSCOPY WITH PROPOFOL;  Surgeon: Lanelle Bal, DO;  [facility];  Service: Endoscopy;  Laterality: N/A;  930am, asa 2   ESOPHAGOGASTRODUODENOSCOPY (EGD) WITH PROPOFOL N/A 09/30/2020   Procedure: ESOPHAGOGASTRODUODENOSCOPY (EGD) WITH PROPOFOL;  Surgeon: Lanelle Bal, DO;  [facility];  Service: Endoscopy;  Laterality: N/A;  am appt   POLYPECTOMY  08/04/2022   Procedure: POLYPECTOMY;  Surgeon: Lanelle Bal, DO;  [facility];  Service: Endoscopy;;   SHOULDER ARTHROSCOPY Right    TONSILLECTOMY     TOTAL SHOULDER ARTHROPLASTY Right 02/22/2014   dr supple    TOTAL  SHOULDER ARTHROPLASTY Right 02/22/2014   Procedure: RIGHT TOTAL SHOULDER ARTHROPLASTY;  Surgeon: Senaida Lange, MD;  Location: [facility];  Service: Orthopedics;  Laterality: Right;    Family History  Problem Relation Age of Onset   Lung cancer Mother    Hypertension Father    Prostate cancer Father    Hypertension Sister    Hypertension Brother    Hypertension Sister    Hypertension Sister    Hypertension Sister    Hypertension Brother    Hypertension Brother    Social History:  reports that he quit smoking about 20 years ago. His smoking use included cigarettes. He started smoking about 40 years ago. He has a 20 pack-year smoking history. He has never used smokeless tobacco. He reports current alcohol use. He reports that he does not use drugs.  Allergies: No Known Allergies  ROS: other than HPI, neg.  Vitals: 98kg, 72"; 63, 158/85, 16, 98% Exam: No cervical lymphadenopathy. Tooth #31 is TTP. There is a draining sinus tract on the distal alveolar ridge with noted purulence. No paresthesia. oropharynx clear. Pre-anes vitals obtained.  Lungs: CTA-B.  Heart: RRR, nl S1,S2.   Abd: s,nt  Radiograph was reviewed -  CBCT in sidexis 09/02/2023: there is a large 4 x 2.5 x 1.2cm, unilocular, well-circumstribed radiolucent lesion in the right ramus/angle; also involving the roots of tooth #31 with no apparent root  resorption. Cortical thinning noted with expansion. IAN canal space appears displaced near the inferior border with no evidence of compression.    Assessment: DDx to include dentigerous cyst, OKC, Ameloblastoma  Discussed the diagnosis, planned procedure, alternative treatments with the pt to include no treatment as well as the benefits, potential risks and complications with the patient.  The possible complications discussed and include but are not limited to:  Pain, swelling, bleeding, infection, damage to adjacent structures, numbness (nerve damage) to the lip/chin/tongue,  complications of anesthesia and possible need for further surgery.  The patient was given an opportunity to ask questions. General anesthesia at [facility] OR was recommended for the procedure. Written preoperative instructions were given. The consent was reviewed.  Plan: recommend enucleation and curretage of right mandibular ramus/angle lesion with bone graft and membrane and extraction of #31. Procedure will be preformed at the [facility].  Anesthesia Request: nasal intubation.  Vivia Ewing, DMD 09/23/2023, 8:35 AM

## 2023-09-27 ENCOUNTER — Other Ambulatory Visit: Payer: Self-pay

## 2023-09-27 ENCOUNTER — Ambulatory Visit (HOSPITAL_BASED_OUTPATIENT_CLINIC_OR_DEPARTMENT_OTHER): Payer: Self-pay | Admitting: Anesthesiology

## 2023-09-27 ENCOUNTER — Ambulatory Visit (HOSPITAL_BASED_OUTPATIENT_CLINIC_OR_DEPARTMENT_OTHER)
Admission: RE | Admit: 2023-09-27 | Discharge: 2023-09-27 | Disposition: A | Discharge status: Home / Self Care | Attending: Oral Surgery | Admitting: Oral Surgery

## 2023-09-27 ENCOUNTER — Encounter (HOSPITAL_BASED_OUTPATIENT_CLINIC_OR_DEPARTMENT_OTHER)
Admission: RE | Disposition: A | Discharge status: Home / Self Care | Payer: Self-pay | Source: Home / Self Care | Attending: Oral Surgery

## 2023-09-27 ENCOUNTER — Encounter (HOSPITAL_BASED_OUTPATIENT_CLINIC_OR_DEPARTMENT_OTHER): Payer: Self-pay | Admitting: Oral Surgery

## 2023-09-27 DIAGNOSIS — M2740 Unspecified cyst of jaw: Secondary | ICD-10-CM

## 2023-09-27 DIAGNOSIS — K090 Developmental odontogenic cysts: Secondary | ICD-10-CM | POA: Diagnosis not present

## 2023-09-27 DIAGNOSIS — Z87891 Personal history of nicotine dependence: Secondary | ICD-10-CM | POA: Insufficient documentation

## 2023-09-27 DIAGNOSIS — I10 Essential (primary) hypertension: Secondary | ICD-10-CM | POA: Diagnosis not present

## 2023-09-27 DIAGNOSIS — M1990 Unspecified osteoarthritis, unspecified site: Secondary | ICD-10-CM | POA: Insufficient documentation

## 2023-09-27 DIAGNOSIS — K219 Gastro-esophageal reflux disease without esophagitis: Secondary | ICD-10-CM | POA: Insufficient documentation

## 2023-09-27 DIAGNOSIS — Z01818 Encounter for other preprocedural examination: Secondary | ICD-10-CM

## 2023-09-27 DIAGNOSIS — L720 Epidermal cyst: Secondary | ICD-10-CM | POA: Diagnosis present

## 2023-09-27 HISTORY — PX: TOOTH EXTRACTION: SHX859

## 2023-09-27 SURGERY — DENTAL RESTORATION/EXTRACTIONS
Anesthesia: General | Site: Mouth

## 2023-09-27 MED ORDER — ONDANSETRON HCL 4 MG/2ML IJ SOLN: 4.0000 mg | Freq: Once | INTRAMUSCULAR | Status: DC | PRN

## 2023-09-27 MED ORDER — BUPIVACAINE-EPINEPHRINE (PF) 0.25% -1:200000 IJ SOLN
INTRAMUSCULAR | Status: AC
  Filled 2023-09-27: qty 30

## 2023-09-27 MED ORDER — PROPOFOL 500 MG/50ML IV EMUL
INTRAVENOUS | Status: AC
  Filled 2023-09-27: qty 50

## 2023-09-27 MED ORDER — EPHEDRINE 5 MG/ML INJ
INTRAVENOUS | Status: AC
  Filled 2023-09-27: qty 10

## 2023-09-27 MED ORDER — ONDANSETRON HCL 4 MG/2ML IJ SOLN
INTRAMUSCULAR | Status: AC
  Filled 2023-09-27: qty 4

## 2023-09-27 MED ORDER — AMISULPRIDE (ANTIEMETIC) 5 MG/2ML IV SOLN: 10.0000 mg | Freq: Once | INTRAVENOUS | Status: DC | PRN

## 2023-09-27 MED ORDER — ACETAMINOPHEN 500 MG PO TABS: 1000.0000 mg | ORAL_TABLET | Freq: Once | ORAL | Status: DC

## 2023-09-27 MED ORDER — SODIUM CHLORIDE 0.9 % IV SOLN
INTRAVENOUS | Status: AC
  Filled 2023-09-27: qty 8

## 2023-09-27 MED ORDER — ONDANSETRON HCL 4 MG/2ML IJ SOLN
INTRAMUSCULAR | Status: DC | PRN
Start: 2023-09-27 — End: 2023-09-27
  Administered 2023-09-27: 4 mg via INTRAVENOUS

## 2023-09-27 MED ORDER — SUGAMMADEX SODIUM 200 MG/2ML IV SOLN
INTRAVENOUS | Status: DC | PRN
  Administered 2023-09-27: 200 mg via INTRAVENOUS

## 2023-09-27 MED ORDER — FENTANYL CITRATE (PF) 100 MCG/2ML IJ SOLN
INTRAMUSCULAR | Status: AC
Start: 2023-09-27 — End: ?
  Filled 2023-09-27: qty 2

## 2023-09-27 MED ORDER — HYDROMORPHONE HCL 1 MG/ML IJ SOLN
0.2500 mg | INTRAMUSCULAR | Status: DC | PRN
  Administered 2023-09-27 (×2): 0.5 mg via INTRAVENOUS

## 2023-09-27 MED ORDER — SODIUM CHLORIDE 0.9 % IV SOLN
3.0000 g | INTRAVENOUS | Status: AC
  Administered 2023-09-27: 3 g via INTRAVENOUS

## 2023-09-27 MED ORDER — OXYMETAZOLINE HCL 0.05 % NA SOLN
NASAL | Status: AC
  Filled 2023-09-27: qty 60

## 2023-09-27 MED ORDER — DEXAMETHASONE SODIUM PHOSPHATE 10 MG/ML IJ SOLN
INTRAMUSCULAR | Status: AC
  Filled 2023-09-27: qty 1

## 2023-09-27 MED ORDER — FENTANYL CITRATE (PF) 100 MCG/2ML IJ SOLN
INTRAMUSCULAR | Status: DC | PRN
  Administered 2023-09-27 (×2): 50 ug via INTRAVENOUS

## 2023-09-27 MED ORDER — DEXAMETHASONE SODIUM PHOSPHATE 4 MG/ML IJ SOLN
INTRAMUSCULAR | Status: DC | PRN
  Administered 2023-09-27: 10 mg via INTRAVENOUS

## 2023-09-27 MED ORDER — LIDOCAINE-EPINEPHRINE 2 %-1:100000 IJ SOLN
INTRAMUSCULAR | Status: DC | PRN
Start: 2023-09-27 — End: 2023-09-27
  Administered 2023-09-27: 9 mL

## 2023-09-27 MED ORDER — ROCURONIUM BROMIDE 10 MG/ML (PF) SYRINGE
PREFILLED_SYRINGE | INTRAVENOUS | Status: AC
  Filled 2023-09-27: qty 10

## 2023-09-27 MED ORDER — HYDROMORPHONE HCL 1 MG/ML IJ SOLN
INTRAMUSCULAR | Status: AC
  Filled 2023-09-27: qty 0.5

## 2023-09-27 MED ORDER — OXYCODONE HCL 5 MG PO TABS: 5.0000 mg | ORAL_TABLET | Freq: Once | ORAL | Status: DC | PRN

## 2023-09-27 MED ORDER — BUPIVACAINE-EPINEPHRINE (PF) 0.25% -1:200000 IJ SOLN
INTRAMUSCULAR | Status: DC | PRN
  Administered 2023-09-27: 8 mL

## 2023-09-27 MED ORDER — HYDROCODONE-ACETAMINOPHEN 5-325 MG PO TABS: 1.0000 | ORAL_TABLET | ORAL | 0 refills | Status: DC | PRN

## 2023-09-27 MED ORDER — ROCURONIUM BROMIDE 100 MG/10ML IV SOLN
INTRAVENOUS | Status: DC | PRN
  Administered 2023-09-27: 70 mg via INTRAVENOUS

## 2023-09-27 MED ORDER — MIDAZOLAM HCL 2 MG/2ML IJ SOLN
INTRAMUSCULAR | Status: AC
Start: 2023-09-27 — End: ?
  Filled 2023-09-27: qty 2

## 2023-09-27 MED ORDER — LIDOCAINE-EPINEPHRINE 2 %-1:100000 IJ SOLN
INTRAMUSCULAR | Status: AC
  Filled 2023-09-27: qty 1

## 2023-09-27 MED ORDER — OXYCODONE HCL 5 MG/5ML PO SOLN: 5.0000 mg | Freq: Once | ORAL | Status: DC | PRN

## 2023-09-27 MED ORDER — PHENYLEPHRINE 80 MCG/ML (10ML) SYRINGE FOR IV PUSH (FOR BLOOD PRESSURE SUPPORT)
PREFILLED_SYRINGE | INTRAVENOUS | Status: AC
  Filled 2023-09-27: qty 20

## 2023-09-27 MED ORDER — AMOXICILLIN 500 MG PO CAPS: 500.0000 mg | ORAL_CAPSULE | Freq: Three times a day (TID) | ORAL | 0 refills | Status: AC

## 2023-09-27 MED ORDER — EPHEDRINE SULFATE (PRESSORS) 50 MG/ML IJ SOLN
INTRAMUSCULAR | Status: DC | PRN
Start: 2023-09-27 — End: 2023-09-27
  Administered 2023-09-27 (×3): 5 mg via INTRAVENOUS

## 2023-09-27 MED ORDER — 0.9 % SODIUM CHLORIDE (POUR BTL) OPTIME
TOPICAL | Status: DC | PRN
  Administered 2023-09-27: 1000 mL

## 2023-09-27 MED ORDER — MIDAZOLAM HCL 2 MG/2ML IJ SOLN
INTRAMUSCULAR | Status: DC | PRN
Start: 2023-09-27 — End: 2023-09-27
  Administered 2023-09-27: 1 mg via INTRAVENOUS

## 2023-09-27 MED ORDER — PHENYLEPHRINE HCL (PRESSORS) 10 MG/ML IV SOLN
INTRAVENOUS | Status: DC | PRN
  Administered 2023-09-27: 80 ug via INTRAVENOUS
  Administered 2023-09-27 (×3): 160 ug via INTRAVENOUS

## 2023-09-27 MED ORDER — PROPOFOL 10 MG/ML IV BOLUS
INTRAVENOUS | Status: DC | PRN
  Administered 2023-09-27: 40 mg via INTRAVENOUS
  Administered 2023-09-27: 160 mg via INTRAVENOUS

## 2023-09-27 MED ORDER — LACTATED RINGERS IV SOLN: INTRAVENOUS | Status: DC

## 2023-09-27 MED ORDER — DEXAMETHASONE SODIUM PHOSPHATE 10 MG/ML IJ SOLN: 10.0000 mg | Freq: Once | INTRAMUSCULAR | Status: DC

## 2023-09-27 MED ORDER — PHENYLEPHRINE 80 MCG/ML (10ML) SYRINGE FOR IV PUSH (FOR BLOOD PRESSURE SUPPORT)
PREFILLED_SYRINGE | INTRAVENOUS | Status: AC
  Filled 2023-09-27: qty 10

## 2023-09-27 MED ORDER — LIDOCAINE HCL (CARDIAC) PF 100 MG/5ML IV SOSY
PREFILLED_SYRINGE | INTRAVENOUS | Status: DC | PRN
  Administered 2023-09-27: 70 mg via INTRAVENOUS

## 2023-09-27 MED ORDER — OXYCODONE HCL 5 MG PO TABS
ORAL_TABLET | ORAL | Status: AC
  Filled 2023-09-27: qty 1

## 2023-09-27 MED ORDER — HYDROMORPHONE HCL 1 MG/ML IJ SOLN
INTRAMUSCULAR | Status: AC
Start: 2023-09-27 — End: ?
  Filled 2023-09-27: qty 0.5

## 2023-09-27 MED ORDER — DEXAMETHASONE SODIUM PHOSPHATE 10 MG/ML IJ SOLN
INTRAMUSCULAR | Status: AC
  Filled 2023-09-27: qty 2

## 2023-09-27 MED ORDER — LIDOCAINE 2% (20 MG/ML) 5 ML SYRINGE
INTRAMUSCULAR | Status: AC
  Filled 2023-09-27: qty 10

## 2023-09-27 MED ORDER — CHLORHEXIDINE GLUCONATE 0.12 % MT SOLN: 15.0000 mL | Freq: Two times a day (BID) | OROMUCOSAL | 2 refills | Status: AC

## 2023-09-27 SURGICAL SUPPLY — 51 items
BENZOIN TINCTURE PRP APPL 2/3 (GAUZE/BANDAGES/DRESSINGS) IMPLANT
BLADE SURG 15 STRL LF DISP TIS (BLADE) ×2 IMPLANT
BONE CHIP PRESERV 5CC PCAN5 (Bone Implant) ×1 IMPLANT
BUR CROSS CUT FISSURE 1.6 (BURR) IMPLANT
BUR EGG ELITE 4.0 (BURR) IMPLANT
BUR PEAR (BURR) IMPLANT
BUR RND FLUTED 2.5 (BURR) IMPLANT
BUR RND OSTEON ELITE 6.0 (BURR) IMPLANT
BUR ROUNG CUTTER 5.0 (BUR) IMPLANT
CANISTER SUCT 1200ML W/VALVE (MISCELLANEOUS) ×2 IMPLANT
CATH ROBINSON RED A/P 10FR (CATHETERS) IMPLANT
COVER BACK TABLE 60X90IN (DRAPES) ×2 IMPLANT
COVER MAYO STAND STRL (DRAPES) ×2 IMPLANT
DRAPE U-SHAPE 76X120 STRL (DRAPES) ×2 IMPLANT
FIBERS DMNRLZD FLO PLIAFX 1 (Tissue) ×1 IMPLANT
GAUZE PACKING IODOFORM 1/4X15 (PACKING) IMPLANT
GLOVE BIO SURGEON STRL SZ 6.5 (GLOVE) ×4 IMPLANT
GLOVE ORTHO TXT STRL SZ7.5 (GLOVE) ×2 IMPLANT
GOWN STRL REUS W/ TWL LRG LVL3 (GOWN DISPOSABLE) ×4 IMPLANT
GOWN STRL REUS W/ TWL XL LVL3 (GOWN DISPOSABLE) ×2 IMPLANT
GRAFT BNE CANC CHIPS 1-8 5CC (Bone Implant) IMPLANT
GRAFT BNE FBR PLIAFX FLO 1 (Tissue) IMPLANT
GRAFT PLACENTAL 2X3 (Tissue) ×1 IMPLANT
GRAFT TISS PLACENTAL 2X3 SOLID (Tissue) IMPLANT
IV NS 500ML BAXH (IV SOLUTION) IMPLANT
MANIFOLD NEPTUNE II (INSTRUMENTS) IMPLANT
NDL BLUNT 17GA (NEEDLE) IMPLANT
NDL DENTAL 27 LONG (NEEDLE) ×2 IMPLANT
NDL HYPO 25GX1X1/2 BEV (NEEDLE) IMPLANT
NEEDLE BLUNT 17GA (NEEDLE) ×2 IMPLANT
NEEDLE DENTAL 27 LONG (NEEDLE) IMPLANT
NEEDLE HYPO 25GX1X1/2 BEV (NEEDLE) ×2 IMPLANT
NS IRRIG 1000ML POUR BTL (IV SOLUTION) ×2 IMPLANT
PACK BASIN DAY SURGERY FS (CUSTOM PROCEDURE TRAY) ×2 IMPLANT
PATTIES SURGICAL .5 X3 (DISPOSABLE) IMPLANT
SCISSORS WIRE ANG 4 3/4 DISP (INSTRUMENTS) IMPLANT
SLEEVE SCD COMPRESS KNEE MED (STOCKING) ×2 IMPLANT
SPONGE SURGIFOAM ABS GEL 12-7 (HEMOSTASIS) IMPLANT
STAPLER SKIN PROX WIDE 3.9 (STAPLE) IMPLANT
SUT BONE WAX W31G (SUTURE) IMPLANT
SUT CHROMIC 3 0 PS 2 (SUTURE) IMPLANT
SUT CHROMIC 4 0 P 3 18 (SUTURE) IMPLANT
SUT SILK 3 0 PS 1 (SUTURE) IMPLANT
SUT STEEL 0 18XMFL TIE 17 (SUTURE) IMPLANT
SUT STEEL 2 (SUTURE) IMPLANT
SUT VIC AB 4-0 PS2 18 (SUTURE) IMPLANT
SYR 50ML LL SCALE MARK (SYRINGE) ×4 IMPLANT
TOOTHBRUSH ADULT (PERSONAL CARE ITEMS) ×2 IMPLANT
TOWEL GREEN STERILE FF (TOWEL DISPOSABLE) ×4 IMPLANT
TUBE CONNECTING 20X1/4 (TUBING) ×2 IMPLANT
YANKAUER SUCT BULB TIP NO VENT (SUCTIONS) ×2 IMPLANT

## 2023-09-27 NOTE — Anesthesia Procedure Notes (Addendum)
 Procedure Name: Intubation Date/Time: 09/27/2023 7:48 AM  Performed by: Earmon Phoenix, CRNAPre-anesthesia Checklist: Patient identified, Emergency Drugs available, Patient being monitored, Timeout performed and Suction available Patient Re-evaluated:Patient Re-evaluated prior to induction Oxygen Delivery Method: Circle system utilized Preoxygenation: Pre-oxygenation with 100% oxygen Induction Type: IV induction Ventilation: Mask ventilation without difficulty Laryngoscope Size: Mac and 3 Grade View: Grade I Tube type: Oral Nasal Tubes: Magill forceps- large, utilized, Right and Nasal Rae Tube size: 7.5 mm Number of attempts: 1 Placement Confirmation: positive ETCO2, breath sounds checked- equal and bilateral and ETT inserted through vocal cords under direct vision Secured at: 26 cm Tube secured with: Tape Dental Injury: Teeth and Oropharynx as per pre-operative assessment

## 2023-09-27 NOTE — Interval H&P Note (Signed)
 History and Physical Interval Note:  09/27/2023 7:29 AM  William Gill  has presented today for surgery, with the diagnosis of Right mandibular cyst.  The various methods of treatment have been discussed with the patient and family. After consideration of risks, benefits and other options for treatment, the patient has consented to  Procedure(s) with comments: DENTAL RESTORATION/EXTRACTIONS (N/A) - Enucleation and Curretage of Right Mandibular Ramus/angle Lesion with Bone graft and Membrane and Extraction of Tooth #31 as a surgical intervention.  The patient's history has been reviewed, patient examined, no change in status, stable for surgery.  I have reviewed the patient's chart and labs.  Questions were answered to the patient's satisfaction.     Jill Alexanders Bristol Osentoski,DMD

## 2023-09-27 NOTE — Op Note (Signed)
 09/27/2023  8:51 AM  PATIENT:  William Gill  68 y.o. male  PRE-OPERATIVE DIAGNOSIS:  Right mandibular cyst  POST-OPERATIVE DIAGNOSIS:  Right mandibular cyst  PROCEDURE:  Procedure(s) with comments: DENTAL RESTORATION/EXTRACTIONS (N/A) - Enucleation and Curretage of Right Mandibular Ramus/angle Lesion with Bone graft and Membrane and Extraction of Tooth #31  SURGEON:  Surgeons and Role:    * Sandeep Radell, DMD - Primary  ANESTHESIA:   general  EBL:  10mL  Complications: none  Specimen: right mandibular cyst  Operative findings:  Cyst with cottage cheese type material within cyst. Lingual cortex dehiscence from lesion.  Inferior alveolar nerve visualized/intact/protected.  Indications for precedure: 68 y/o M with right mandibular body/angle lesion involving tooth #31 requiring surgical excision.  Procedure: The patient was identified in the preoperative holding by both anesthesia and the maxillofacial teams.  Health history was reviewed.  Consent was verified.  The patient was brought back to the operating room placed in the table in the supine position.  Standard ASA leads and monitors were placed.  The patient was preoxygenated, induced, and his airway was protected with a nasal endotracheal tube.  The tube was taped and secured by the anesthesia care team.  The patient was then prepped and draped for standard maxillofacial procedure.  Local anesthetic was placed in the right posterior mandible.  A 15 blade was used to make a crestal incision with sulcular and distal fascial releases in the right posterior mandible.  A full-thickness flap was elevated to the facial then coursing posteriorly then lingually.  Tooth #31 was sectioned with a handpiece and extracted in multiple pieces in its entirety.  A round bur under irrigation was utilized to unroofed the cystic lesion in the right posterior mandible ramus/angle area.  The lesion was enucleated entirely with a curette.  The specimen  was then placed in formalin and will be submitted for histopathologic examination.  Mechanical curettage was then performed with use of an aggressive curette along with a round bur under copious irrigation.  The inferior alveolar neurovascular bundle was visualized in the anterior portion was protected and with remained intact.  The site was then thoroughly irrigated.  Bone allograft was then placed into the defect and covered with a resorbable membrane.  The tissues were then reapproximated and closed primarily with multiple 3-0 Chromic Gut sutures.  At this point the entire oral cavity was then thoroughly irrigated.  Throat pack was removed.  The patient was then turned back over to the anesthesia care team where he was extubated without event.  He was transferred to the postanesthesia care unit for recovery and will be discharged once meeting appropriate discharge criteria.  Junita Push, DMD Oral & Maxillofacial Surgery

## 2023-09-27 NOTE — Anesthesia Preprocedure Evaluation (Addendum)
 Anesthesia Evaluation  Patient identified by MRN, date of birth, ID band Patient awake    Reviewed: Allergy & Precautions, H&P , NPO status , Patient's Chart, lab work & pertinent test results, reviewed documented beta blocker date and time   Airway Mallampati: III  TM Distance: >3 FB Neck ROM: Full    Dental  (+) Missing, Dental Advisory Given   Pulmonary former smoker   Pulmonary exam normal breath sounds clear to auscultation       Cardiovascular hypertension (129/74 preop), Pt. on home beta blockers and Pt. on medications Normal cardiovascular exam Rhythm:Regular Rate:Normal     Neuro/Psych negative neurological ROS  negative psych ROS   GI/Hepatic Neg liver ROS,GERD  Controlled and Medicated,,  Endo/Other  negative endocrine ROS    Renal/GU negative Renal ROS  negative genitourinary   Musculoskeletal  (+) Arthritis , Osteoarthritis,    Abdominal   Peds negative pediatric ROS (+)  Hematology negative hematology ROS (+)   Anesthesia Other Findings   Reproductive/Obstetrics negative OB ROS                             Anesthesia Physical Anesthesia Plan  ASA: 2  Anesthesia Plan: General   Post-op Pain Management: Tylenol PO (pre-op)*   Induction: Intravenous  PONV Risk Score and Plan: 3 and Ondansetron, Dexamethasone, Midazolam and Treatment may vary due to age or medical condition  Airway Management Planned: Nasal ETT  Additional Equipment: None  Intra-op Plan:   Post-operative Plan: Extubation in OR  Informed Consent: I have reviewed the patients History and Physical, chart, labs and discussed the procedure including the risks, benefits and alternatives for the proposed anesthesia with the patient or authorized representative who has indicated his/her understanding and acceptance.     Dental advisory given  Plan Discussed with: CRNA  Anesthesia Plan Comments:         Anesthesia Quick Evaluation

## 2023-09-27 NOTE — Brief Op Note (Signed)
 09/27/2023  8:51 AM  PATIENT:  William Gill  68 y.o. male  PRE-OPERATIVE DIAGNOSIS:  Right mandibular cyst  POST-OPERATIVE DIAGNOSIS:  Right mandibular cyst  PROCEDURE:  Procedure(s) with comments: DENTAL RESTORATION/EXTRACTIONS (N/A) - Enucleation and Curretage of Right Mandibular Ramus/angle Lesion with Bone graft and Membrane and Extraction of Tooth #31  SURGEON:  Surgeons and Role:    * Aleida Crandell, Jill Alexanders, DMD - Primary  ANESTHESIA:   general  EBL:  10mL  BLOOD ADMINISTERED:none  DRAINS: none   LOCAL MEDICATIONS USED:  LIDOCAINE   SPECIMEN:  Right mandibular Cyst  DISPOSITION OF SPECIMEN:  PATHOLOGY  COUNTS:  YES  TOURNIQUET:  * No tourniquets in log *  DICTATION: .Dragon Dictation  PLAN OF CARE: Discharge to home after PACU  PATIENT DISPOSITION:  PACU - hemodynamically stable.   Delay start of Pharmacological VTE agent (>24hrs) due to surgical blood loss or risk of bleeding: not applicable

## 2023-09-27 NOTE — Transfer of Care (Signed)
 Immediate Anesthesia Transfer of Care Note  Patient: William Gill  Procedure(s) Performed: DENTAL RESTORATION/EXTRACTIONS (Mouth)  Patient Location: PACU  Anesthesia Type:General  Level of Consciousness: awake, alert , oriented, and patient cooperative  Airway & Oxygen Therapy: Patient Spontanous Breathing and Patient connected to nasal cannula oxygen  Post-op Assessment: Report given to RN and Post -op Vital signs reviewed and stable  Post vital signs: Reviewed and stable  Last Vitals:  Vitals Value Taken Time  BP 132/62 09/27/23 0906  Temp 36.5 C 09/27/23 0906  Pulse 66 09/27/23 0907  Resp 15 09/27/23 0907  SpO2 96 % 09/27/23 0907  Vitals shown include unfiled device data.  Last Pain:  Vitals:   09/27/23 0906  TempSrc:   PainSc: 5       Patients Stated Pain Goal: 3 (09/27/23 1308)  Complications: No notable events documented.

## 2023-09-27 NOTE — Discharge Instructions (Addendum)
  See paper from dr. Kenney Houseman for instructions post-op  Post Anesthesia Home Care Instructions  Activity: Get plenty of rest for the remainder of the day. A responsible individual must stay with you for 24 hours following the procedure.  For the next 24 hours, DO NOT: -Drive a car -Advertising copywriter -Drink alcoholic beverages -Take any medication unless instructed by your physician -Make any legal decisions or sign important papers.  Meals: Start with liquid foods such as gelatin or soup. Progress to regular foods as tolerated. Avoid greasy, spicy, heavy foods. If nausea and/or vomiting occur, drink only clear liquids until the nausea and/or vomiting subsides. Call your physician if vomiting continues.  Special Instructions/Symptoms: Your throat may feel dry or sore from the anesthesia or the breathing tube placed in your throat during surgery. If this causes discomfort, gargle with warm salt water. The discomfort should disappear within 24 hours.

## 2023-09-28 ENCOUNTER — Encounter (HOSPITAL_BASED_OUTPATIENT_CLINIC_OR_DEPARTMENT_OTHER): Payer: Self-pay | Admitting: Oral Surgery

## 2023-09-28 LAB — SURGICAL PATHOLOGY

## 2023-09-29 NOTE — Anesthesia Postprocedure Evaluation (Signed)
 Anesthesia Post Note  Patient: William Gill  Procedure(s) Performed: DENTAL RESTORATION/EXTRACTIONS (Mouth)     Patient location during evaluation: PACU Anesthesia Type: General Level of consciousness: awake and alert Pain management: pain level controlled Vital Signs Assessment: post-procedure vital signs reviewed and stable Respiratory status: spontaneous breathing, nonlabored ventilation and respiratory function stable Cardiovascular status: blood pressure returned to baseline and stable Postop Assessment: no apparent nausea or vomiting Anesthetic complications: no   No notable events documented.  Last Vitals:  Vitals:   09/27/23 0930 09/27/23 0952  BP: 126/63 (!) 145/69  Pulse: 66 66  Resp: 15   Temp:  36.9 C  SpO2: 94% 94%    Last Pain:  Vitals:   09/27/23 0952  TempSrc: Temporal  PainSc:                  Evie Crumpler

## 2023-10-28 DIAGNOSIS — M7122 Synovial cyst of popliteal space [Baker], left knee: Secondary | ICD-10-CM | POA: Diagnosis not present

## 2023-10-28 DIAGNOSIS — M5134 Other intervertebral disc degeneration, thoracic region: Secondary | ICD-10-CM | POA: Diagnosis not present

## 2023-10-28 DIAGNOSIS — M1991 Primary osteoarthritis, unspecified site: Secondary | ICD-10-CM | POA: Diagnosis not present

## 2023-10-28 DIAGNOSIS — I10 Essential (primary) hypertension: Secondary | ICD-10-CM | POA: Diagnosis not present

## 2023-10-28 DIAGNOSIS — K090 Developmental odontogenic cysts: Secondary | ICD-10-CM | POA: Diagnosis not present

## 2023-11-23 DIAGNOSIS — M5136 Other intervertebral disc degeneration, lumbar region with discogenic back pain only: Secondary | ICD-10-CM | POA: Diagnosis not present

## 2023-11-23 DIAGNOSIS — M545 Low back pain, unspecified: Secondary | ICD-10-CM | POA: Diagnosis not present

## 2023-11-23 DIAGNOSIS — G894 Chronic pain syndrome: Secondary | ICD-10-CM | POA: Diagnosis not present

## 2023-11-23 DIAGNOSIS — E7489 Other specified disorders of carbohydrate metabolism: Secondary | ICD-10-CM | POA: Diagnosis not present

## 2023-12-29 ENCOUNTER — Ambulatory Visit: Attending: Cardiology | Admitting: Cardiology

## 2023-12-29 ENCOUNTER — Encounter: Payer: Self-pay | Admitting: Cardiology

## 2023-12-29 VITALS — BP 138/88 | HR 73 | Ht 74.0 in | Wt 210.2 lb

## 2023-12-29 DIAGNOSIS — E782 Mixed hyperlipidemia: Secondary | ICD-10-CM | POA: Diagnosis not present

## 2023-12-29 DIAGNOSIS — I10 Essential (primary) hypertension: Secondary | ICD-10-CM

## 2023-12-29 DIAGNOSIS — R002 Palpitations: Secondary | ICD-10-CM

## 2023-12-29 DIAGNOSIS — I6529 Occlusion and stenosis of unspecified carotid artery: Secondary | ICD-10-CM | POA: Diagnosis not present

## 2023-12-29 NOTE — Patient Instructions (Signed)
Medication Instructions:  Your physician recommends that you continue on your current medications as directed. Please refer to the Current Medication list given to you today.   Labwork: None today  Testing/Procedures: Your physician has requested that you have a carotid duplex. This test is an ultrasound of the carotid arteries in your neck. It looks at blood flow through these arteries that supply the brain with blood. Allow one hour for this exam. There are no restrictions or special instructions.   Follow-Up: 1 year  Any Other Special Instructions Will Be Listed Below (If Applicable).  If you need a refill on your cardiac medications before your next appointment, please call your pharmacy.

## 2023-12-29 NOTE — Progress Notes (Signed)
    Cardiology Office Note  Date: 12/29/2023   ID: William Gill Jul 20, 1955, MRN [id number]  History of Present Illness: William Gill is a 68 y.o. male last seen in the office in November 2023.  He is here for a routine visit.  Reports rare feeling of brief palpitations, no progressive symptoms on atenolol  100 mg twice daily.  No sudden dizziness or syncope.  He had a CT of the neck done in February as workup of mandibular cyst.  Incidental mention made of aortic atherosclerosis as well as proximal ICA atherosclerosis.  Echocardiogram from 2021 did not report significantly diseased aortic valve.  He has not had carotid Dopplers.  We went over his medications.  He reports compliance with treatment.  Plan to have follow-up physical with lipid panel per PCP later this year.  His LDL was 102 in March 2024.  Physical Exam: VS:  BP 138/88 (BP Location: Right Arm)   Pulse 73   Ht 6' 2 (1.88 m)   Wt 210 lb 3.2 oz (95.3 kg)   SpO2 95%   BMI 26.99 kg/m , BMI Body mass index is 26.99 kg/m.  Wt Readings from Last 3 Encounters:  12/29/23 210 lb 3.2 oz (95.3 kg)  09/27/23 210 lb 12.2 oz (95.6 kg)  05/27/23 215 lb (97.5 kg)    General: Patient appears comfortable at rest. HEENT: Conjunctiva and lids normal. Neck: Supple, no elevated JVP or carotid bruits. Lungs: Clear to auscultation, nonlabored breathing at rest. Cardiac: Regular rate and rhythm, no S3, 1-2/6 basal systolic murmur, no pericardial rub. Extremities: No pitting edema.  ECG:  An ECG dated 09/23/2023 was personally reviewed today and demonstrated:  Sinus rhythm with decreased R wave progression.  Labwork: March 2024: Cholesterol 165, triglycerides 147, HDL 37, LDL 102 03/01/2023: BUN 24; Creatinine, Ser 1.34; Hemoglobin 12.8; Platelets 362; Potassium 3.9; Sodium 136   Other Studies Reviewed Today:  No interval cardiac testing for review today.  Assessment and Plan:  1.  History of palpitations with previously  documented atrial and ventricular ectopy as well as brief bursts of SVT.  No progressive symptoms or associated syncope.  I reviewed his interval ECG.  Continue atenolol  100 mg twice daily.  2.  Primary hypertension.  Continue present regimen including lisinopril  10 mg daily which has further room to uptitrate if necessary, also Norvasc  10 mg daily.  He plans to continue follow-up with Dr. Marvine for primary care.  3.  Mixed hyperlipidemia.  Last LDL 102 in March 2024, follow-up FLP with PCP later this year.  Currently on Lipitor 80 mg daily, Zetia 10 mg daily, and fenofibrate  160 mg daily.  4.  Aortic atherosclerosis by CT imaging, also mention of proximal ICA atherosclerosis.  Plan to obtain screening carotid Dopplers.  Disposition:  Follow up 1 year.  Signed, William Gill, M.D., F.A.C.C. [facility]

## 2024-01-06 ENCOUNTER — Ambulatory Visit (HOSPITAL_COMMUNITY)
Admission: RE | Admit: 2024-01-06 | Discharge: 2024-01-06 | Disposition: A | Discharge status: Home / Self Care | Source: Ambulatory Visit | Attending: Cardiology | Admitting: Cardiology

## 2024-01-06 DIAGNOSIS — I6529 Occlusion and stenosis of unspecified carotid artery: Secondary | ICD-10-CM | POA: Diagnosis not present

## 2024-01-06 DIAGNOSIS — H401131 Primary open-angle glaucoma, bilateral, mild stage: Secondary | ICD-10-CM | POA: Diagnosis not present

## 2024-01-06 DIAGNOSIS — Q141 Congenital malformation of retina: Secondary | ICD-10-CM | POA: Diagnosis not present

## 2024-01-06 DIAGNOSIS — I779 Disorder of arteries and arterioles, unspecified: Secondary | ICD-10-CM | POA: Diagnosis not present

## 2024-01-06 DIAGNOSIS — H524 Presbyopia: Secondary | ICD-10-CM | POA: Diagnosis not present

## 2024-01-06 DIAGNOSIS — H26492 Other secondary cataract, left eye: Secondary | ICD-10-CM | POA: Diagnosis not present

## 2024-01-06 DIAGNOSIS — H35371 Puckering of macula, right eye: Secondary | ICD-10-CM | POA: Diagnosis not present

## 2024-01-06 DIAGNOSIS — I6523 Occlusion and stenosis of bilateral carotid arteries: Secondary | ICD-10-CM | POA: Diagnosis not present

## 2024-01-10 ENCOUNTER — Ambulatory Visit: Payer: Self-pay | Admitting: Cardiology

## 2024-01-27 DIAGNOSIS — M5136 Other intervertebral disc degeneration, lumbar region with discogenic back pain only: Secondary | ICD-10-CM | POA: Diagnosis not present

## 2024-01-27 DIAGNOSIS — G894 Chronic pain syndrome: Secondary | ICD-10-CM | POA: Diagnosis not present

## 2024-01-27 DIAGNOSIS — I10 Essential (primary) hypertension: Secondary | ICD-10-CM | POA: Diagnosis not present

## 2024-01-27 DIAGNOSIS — M1991 Primary osteoarthritis, unspecified site: Secondary | ICD-10-CM | POA: Diagnosis not present
